# Patient Record
Sex: Male | Born: 1940 | Race: White | Hispanic: No | Marital: Married | Smoking: Former smoker
Health system: Southern US, Community
[De-identification: ages and names within clinical notes are randomized; demographics above are authoritative.]

## PROBLEM LIST (undated history)

## (undated) DIAGNOSIS — K219 Gastro-esophageal reflux disease without esophagitis: Secondary | ICD-10-CM

## (undated) DIAGNOSIS — M1990 Unspecified osteoarthritis, unspecified site: Secondary | ICD-10-CM

## (undated) DIAGNOSIS — N400 Enlarged prostate without lower urinary tract symptoms: Secondary | ICD-10-CM

## (undated) DIAGNOSIS — I10 Essential (primary) hypertension: Secondary | ICD-10-CM

## (undated) DIAGNOSIS — H269 Unspecified cataract: Secondary | ICD-10-CM

## (undated) DIAGNOSIS — N189 Chronic kidney disease, unspecified: Secondary | ICD-10-CM

## (undated) DIAGNOSIS — E785 Hyperlipidemia, unspecified: Secondary | ICD-10-CM

## (undated) HISTORY — DX: Unspecified osteoarthritis, unspecified site: M19.90

## (undated) HISTORY — DX: Gastro-esophageal reflux disease without esophagitis: K21.9

## (undated) HISTORY — DX: Benign prostatic hyperplasia without lower urinary tract symptoms: N40.0

## (undated) HISTORY — DX: Unspecified cataract: H26.9

## (undated) HISTORY — PX: TONSILLECTOMY: SUR1361

## (undated) HISTORY — DX: Essential (primary) hypertension: I10

## (undated) HISTORY — PX: POLYPECTOMY: SHX149

## (undated) HISTORY — PX: COLONOSCOPY: SHX174

## (undated) HISTORY — DX: Chronic kidney disease, unspecified: N18.9

## (undated) HISTORY — PX: ANGIOGRAM/LV (CONGENITAL): SHX6166

## (undated) HISTORY — DX: Hyperlipidemia, unspecified: E78.5

## (undated) HISTORY — PX: OTHER SURGICAL HISTORY: SHX169

---

## 2005-07-14 ENCOUNTER — Emergency Department (HOSPITAL_COMMUNITY): Admission: AD | Admit: 2005-07-14 | Discharge: 2005-07-14 | Payer: Self-pay | Admitting: Family Medicine

## 2006-02-17 ENCOUNTER — Encounter: Admission: RE | Admit: 2006-02-17 | Discharge: 2006-02-17 | Payer: Self-pay | Admitting: Internal Medicine

## 2006-03-03 ENCOUNTER — Ambulatory Visit (HOSPITAL_BASED_OUTPATIENT_CLINIC_OR_DEPARTMENT_OTHER): Admission: RE | Admit: 2006-03-03 | Discharge: 2006-03-03 | Payer: Self-pay | Admitting: Urology

## 2009-08-21 ENCOUNTER — Encounter: Payer: Self-pay | Admitting: Internal Medicine

## 2009-09-17 ENCOUNTER — Encounter (INDEPENDENT_AMBULATORY_CARE_PROVIDER_SITE_OTHER): Payer: Self-pay | Admitting: *Deleted

## 2009-10-20 ENCOUNTER — Encounter (INDEPENDENT_AMBULATORY_CARE_PROVIDER_SITE_OTHER): Payer: Self-pay | Admitting: *Deleted

## 2009-10-22 ENCOUNTER — Ambulatory Visit: Payer: Self-pay | Admitting: Internal Medicine

## 2009-11-05 ENCOUNTER — Ambulatory Visit: Payer: Self-pay | Admitting: Internal Medicine

## 2009-11-07 ENCOUNTER — Encounter: Payer: Self-pay | Admitting: Internal Medicine

## 2010-02-02 NOTE — Letter (Signed)
Summary: [facility] Instructions  [name]  [address] [place], [place] [address]   Phone: [phone]  Fax: [phone]       Paul Herring    01/28/1940    MRN: [id number]        Procedure Day [name]:  [name] [birth year]     Arrival Time: 8:00am     Procedure Time: 9:00am     Location of Procedure:                    [name]  [facility] (4th Floor)                        PREPARATION FOR COLONOSCOPY WITH MOVIPREP   Starting 5 days prior to your procedure SATURDAY 10/29  do not eat nuts, seeds, popcorn, corn, beans, peas,  salads, or any raw vegetables.  Do not take any fiber supplements (e.g. Metamucil, Citrucel, and Benefiber).  THE DAY BEFORE YOUR PROCEDURE         DATE: [facility]  11/02  1.  Drink clear liquids the entire day-NO SOLID FOOD  2.  Do not drink anything colored red or purple.  Avoid juices with pulp.  No orange juice.  3.  Drink at least 64 oz. (8 glasses) of fluid/clear liquids during the day to prevent dehydration and help the prep work efficiently.  CLEAR LIQUIDS INCLUDE: Water Jello Ice Popsicles Tea (sugar ok, no milk/cream) Powdered fruit flavored drinks Coffee (sugar ok, no milk/cream) Gatorade Juice: apple, white grape, white cranberry  Lemonade Clear bullion, consomm, broth Carbonated beverages (any kind) Strained chicken noodle soup Hard Candy                             4.  In the morning, mix first dose of MoviPrep solution:    Empty 1 Pouch A and 1 Pouch B into the disposable container    Add lukewarm drinking water to the top line of the container. Mix to dissolve    Refrigerate (mixed solution should be used within 24 hrs)  5.  Begin drinking the prep at 5:00 p.m. The MoviPrep container is divided by 4 marks.   Every 15 minutes drink the solution down to the next mark (approximately 8 oz) until the full liter is complete.   6.  Follow completed prep with 16 oz of clear liquid of your choice  (Nothing red or purple).  Continue to drink clear liquids until bedtime.  7.  Before going to bed, mix second dose of MoviPrep solution:    Empty 1 Pouch A and 1 Pouch B into the disposable container    Add lukewarm drinking water to the top line of the container. Mix to dissolve    Refrigerate  THE DAY OF YOUR PROCEDURE      DATE: THURSDAY 11/03  Beginning at  4:00 a.m. (5 hours before procedure):         1. Every 15 minutes, drink the solution down to the next mark (approx 8 oz) until the full liter is complete.  2. Follow completed prep with 16 oz. of clear liquid of your choice.    3. You may drink clear liquids until  7:00am  (2 HOURS BEFORE PROCEDURE).   MEDICATION INSTRUCTIONS  Unless otherwise instructed, you should take regular prescription medications with a small sip of water   as early as possible the morning  of your procedure.         OTHER INSTRUCTIONS  You will need a responsible adult at least 72 years of age to accompany you and drive you home.   This person must remain in the waiting room during your procedure.  Wear loose fitting clothing that is easily removed.  Leave jewelry and other valuables at home.  However, you may wish to bring a book to read or  an iPod/MP3 player to listen to music as you wait for your procedure to start.  Remove all body piercing jewelry and leave at home.  Total time from sign-in until discharge is approximately 2-3 hours.  You should go home directly after your procedure and rest.  You can resume normal activities the  day after your procedure.  The day of your procedure you should not:   Drive   Make legal decisions   Operate machinery   Drink alcohol   Return to work  You will receive specific instructions about eating, activities and medications before you leave.    The above instructions have been reviewed and explained to me by  Karl Bales RN  October 22, 2009 1:26 PM    I fully  understand and can verbalize these instructions _____________________________ Date _________

## 2010-02-02 NOTE — Procedures (Signed)
Summary: Colonoscopy  Patient: [name] Note: All result statuses are Final unless otherwise noted.  Tests: (1) Colonoscopy (COL)   COL Colonoscopy           DONE     [facility]     [address]     [place], [place]  [address]           COLONOSCOPY PROCEDURE REPORT           PATIENT:  Paul Herring, Paul Herring  MR#:  [id number]     BIRTHDATE:  [birth year], 69 yrs. old  GENDER:  male     ENDOSCOPIST:  Wilhemina Bonito. Eda Keys, MD     REF. BY:  Kari Baars, M.D.     PROCEDURE DATE:  11/05/2009     PROCEDURE:  Colonoscopy with snare polypectomy x 4     ASA CLASS:  Class II     INDICATIONS:  Routine Risk Screening     MEDICATIONS:   Fentanyl 100 mcg IV, Versed 10 mg IV           DESCRIPTION OF PROCEDURE:   After the risks benefits and     alternatives of the procedure were thoroughly explained, informed     consent was obtained.  Digital rectal exam was performed and     revealed no abnormalities.   The LB CF-[id number] endoscope     was introduced through the anus and advanced to the cecum, which     was identified by both the appendix and ileocecal valve, without     limitations.Time to cecum = 4:27 min. The quality of the prep was     excellent, using MoviPrep.  The instrument was then slowly     withdrawn (14:07 min) as the colon was fully examined.     <<PROCEDUREIMAGES>>           FINDINGS:  Four polyps were found - 6mm in the cecum and     73mm,3mm,6mm in ascending colon. Polyps were snared without     cautery. Retrieval was successful.   Severe diverticulosis was     found in the left colon.   Retroflexed views in the rectum     revealed internal hemorrhoids.    The scope was then withdrawn     from the patient and the procedure completed.           COMPLICATIONS:  None     ENDOSCOPIC IMPRESSION:     1) Four polyps - removed     2) Severe diverticulosis in the left colon     3) Internal hemorrhoids           RECOMMENDATIONS:     1) Follow up colonoscopy in 5  years           ______________________________     Wilhemina Bonito. Eda Keys, MD           CC:  Kari Baars, MD; The Patient           n.     eSIGNED:   Wilhemina Bonito. Eda Keys at 11/05/2009 10:03 AM           Royetta Crochet, [id number]  Note: An exclamation mark (!) indicates a result that was not dispersed into the flowsheet. Document Creation Date: 11/05/2009 10:04 AM _______________________________________________________________________  (1) Order result status: Final Collection or observation date-time: 11/05/2009 09:55 Requested date-time:  Receipt date-time:  Reported date-time:  Referring Physician:   Ordering Physician: Jonny Ruiz  Eda Keys [phone]) Specimen Source:  Source: Launa Grill Order Number: [id number] Lab site:   Appended Document: Colonoscopy     Procedures Next Due Date:    Colonoscopy: 11/2014

## 2010-02-02 NOTE — Letter (Signed)
Summary: [facility]  [facility]   Imported By: Sherian Rein 11/19/2009 14:17:59  _____________________________________________________________________  External Attachment:    Type:   Image     Comment:   External Document

## 2010-02-02 NOTE — Letter (Signed)
Summary: Patient's Medication List/[facility]  Patient's Medication List/[facility]   Imported By: Sherian Rein 11/19/2009 14:16:25  _____________________________________________________________________  External Attachment:    Type:   Image     Comment:   External Document

## 2010-02-02 NOTE — Miscellaneous (Signed)
Summary: [facility] previsit  Clinical Lists Changes  Medications: Added new medication of MOVIPREP 100 GM  SOLR (PEG-KCL-NACL-NASULF-NA ASC-C) As per prep instructions. - Signed Rx of MOVIPREP 100 GM  SOLR (PEG-KCL-NACL-NASULF-NA ASC-C) As per prep instructions.;  #1 x 0;  Signed;  Entered by: Karl Bales RN;  Authorized by: Hilarie Fredrickson MD;  Method used: [facility]  [phone]*, [address], [place], [place]  [address], Ph: [phone] or [phone], Fax: [phone] Allergies: Added new allergy or adverse reaction of TALWIN Observations: Added new observation of NKA: F (10/22/2009 12:55)    Prescriptions: MOVIPREP 100 GM  SOLR (PEG-KCL-NACL-NASULF-NA ASC-C) As per prep instructions.  #1 x 0   Entered by:   Karl Bales RN   Authorized by:   Hilarie Fredrickson MD   Signed by:   Karl Bales RN on 10/22/2009   Method used:   [facility] to        [facility]  [phone]* (retail)       [address] [place], [place]  [address]       Ph: [phone] or [phone]       Fax: [phone]   RxID:   [id number]

## 2010-02-02 NOTE — Letter (Signed)
Summary: Pre Visit Letter Revised  [facility]  [address] [place], [place] [address]   Phone: [phone]  Fax: [phone]        09/17/2009 MRN: [id number] [facility] [address] [place], [place]  [address]             Procedure Date:  11-05-09   Welcome to the Gastroenterology Division at [facility].    You are scheduled to see a nurse for your pre-procedure visit on 10-22-09 at 1:00p.m. on the 3rd floor at [facility], [address]  We ask that you try to arrive at our office 15 minutes prior to your appointment time to allow for check-in.  Please take a minute to review the attached form.  If you answer "Yes" to one or more of the questions on the first page, we ask that you call the person listed at your earliest opportunity.  If you answer "No" to all of the questions, please complete the rest of the form and bring it to your appointment.    Your nurse visit will consist of discussing your medical and surgical history, your immediate family medical history, and your medications.   If you are unable to list all of your medications on the form, please bring the medication bottles to your appointment and we will list them.  We will need to be aware of both prescribed and over the counter drugs.  We will need to know exact dosage information as well.    Please be prepared to read and sign documents such as consent forms, a financial agreement, and acknowledgement forms.  If necessary, and with your consent, a friend or relative is welcome to sit-in on the nurse visit with you.  Please bring your insurance card so that we may make a copy of it.  If your insurance requires a referral to see a specialist, please bring your referral form from your primary care physician.  No co-pay is required for this nurse visit.     If you cannot keep your appointment, please call [phone] to cancel or reschedule prior to your appointment date.  This  allows [place] the opportunity to schedule an appointment for another patient in need of care.    Thank you for choosing [facility] for your medical needs.  We appreciate the opportunity to care for you.  Please visit [place] at our website  to learn more about our practice.  Sincerely, The Gastroenterology Division

## 2010-02-02 NOTE — Letter (Signed)
Summary: Patient Notice- Polyp Results  [facility]  [address] [place], [place] [address]   Phone: [phone]  Fax: [phone]        November 07, 2009 MRN: [id number]    [facility] [address] [place], [place]  [address]    Dear Paul Herring,  I am pleased to inform you that the colon polyp(s) removed during your recent colonoscopy was (were) found to be benign (no cancer detected) upon pathologic examination.  I recommend you have a repeat colonoscopy examination in 5 years to look for recurrent polyps, as having colon polyps increases your risk for having recurrent polyps or even colon cancer in the future.  Should you develop new or worsening symptoms of abdominal pain, bowel habit changes or bleeding from the rectum or bowels, please schedule an evaluation with either your primary care physician or with me.   Additional information/recommendations:  __ No further action with gastroenterology is needed at this time. Please      follow-up with your primary care physician for your other healthcare      needs.    Please call us if you are having persistent problems or have questions about your condition that have not been fully answered at this time.  Sincerely,  Paul Fredrickson MD  This letter has been electronically signed by your physician.  Appended Document: Patient Notice- Polyp Results letter mailed

## 2010-05-21 NOTE — Op Note (Signed)
NAME:  Paul Herring, Paul Herring              ACCOUNT NO.:  [id number]   MEDICAL RECORD NO.:  [id number]          PATIENT TYPE:  AMB   LOCATION:  [facility]                         FACILITY:  [facility]   PHYSICIAN:  Mark C. Vernie Ammons, M.D.  DATE OF BIRTH:  1940-06-28   DATE OF PROCEDURE:  03/03/2006  DATE OF DISCHARGE:                               OPERATIVE REPORT   PREOP DIAGNOSIS:  Bilateral hydronephrosis.   POSTOP DIAGNOSIS:  1. Bilateral hydronephrosis.  2. Possible bladder mass.  3. Benign prostatic hypertrophy with bladder outlet obstruction.   PROCEDURE:  1. Cystoscopy.  2. Bilateral retrograde pyelograms with interpretation.  3. Right ureteroscopy.  4. Bilateral double-J stent placements.   SURGEON:  Mark C. Vernie Ammons, M.D.   ANESTHESIA:  General.   BLOOD LOSS:  Approximately 25 mL.   DRAINS:  A 6-French, 26-cm, Polaris stent in right-and-left ureter (no  string) and 16-French Foley catheter.   COMPLICATIONS:  None.   INDICATIONS:  The patient is a 70 year old white male who I initially  saw for obstructive voiding symptoms.  I placed him on some Flomax; and  he noted some improvement in his voiding; however, when he followed up  with his primary care physician, he was found to have an elevated  creatinine to 2.5.  A renal ultrasound revealed bilateral  hydronephrosis.  I placed a Foley catheter in his bladder; and follow-up  ultrasound revealed decreased hydro although there was some still  present.  There was also improvement of his creatinine.  He was brought  to the OR today for further investigation of other causes of his  bilateral hydronephrosis; and an evaluation of the prostatic urethra  with stent placement.   DESCRIPTION OF OPERATION:  After informed consent, the patient was  brought to the major OR, and placed on the table and administered  general anesthesia, then moved to the dorsal lithotomy position.  His  genitalia was sterilely prepped and draped.  Initially I  introduced a 76-  [place] cystoscope with 12-degree lens per urethra; and under direct  visualization of the urethra entirely normal down to the sphincter which  appears intact.  The prostate revealed bilobar hypertrophy; and a large  median lobe with a high bladder neck.  Upon entering the bladder, I  note, Foley catheter irritation of the wall of the bladder.  No  worrisome tumors or stones, or other lesions were seen on the posterior  wall; but near the floor of the bladder, a little bit more toward the  right-hand side, there appeared to be a raised area that appeared to be  separate from the prostate; and did not appear to be the median lobe.  This was visualized with both the 12-and-70-degree lenses.  It was broad-  based, separate, and appeared cystic with normal-appearing mucosa  overlying this.  There was also 4+ trabeculation with some small  cellules seen.   A 6-French open-ended ureteral catheter was then placed in the right  ureteral orifice; and full strength iodinated contrast was then injected  through the open-ended catheter; and under direct visualization I noted  significant J-hooking of  the distal ureter.  There was no other  abnormality seen along the course of the ureter such as filling defects  or mass effect.  It was tortuous; and then the renal pelvis was noted be  fairly dilated.  The calyces appeared to be blunted.  There was no  definite filling defects seen within the renal pelvis or ureter.  I then  attempted to pass a floppy-tip guidewire through the open-ended ureteral  catheter; but was unsuccessful, so I switched to a Glidewire.  I still  was unable to get the Glidewire to progress up the ureter; and it  appeared that due to the severe J-hooking that the Glidewire was noted  to be what appeared to be outside the ureter.  I, therefore, inserted  the flexible ureteroscope, but could not visualize the ureteral orifice  well enough, so I switched to the  6-French rigid ureteroscope; and  passed this, under direct vision into the bladder.   I was able to identify the right ureteral orifice; and passed the  ureteroscope into the distal ureter.  I did note where the guidewire  appeared to have gone through the wall of the ureter because of the  severe turn and J-hooking and also visualized the lumen.  With the lumen  visualized, I passed the guidewire through the ureteroscope and through  the lumen up into the renal pelvis without difficulty, under direct  fluoroscopic control.  I then removed the ureteroscope, and left the  guidewire in place.  Over the guidewire, I passed the cystoscope back  down into the bladder, and then passed the Polaris stent over the  guidewire, up the right ureter, and then removed the guidewire with good  curl being noted in the renal pelvis.   I then directed my attention to the left orifice.  It was identified and  the open-ended 6-French ureteral catheter was placed in the orifice; and  her left retrograde pyelogram was performed in identical fashion with  nearly identical findings, that being fairly significant J-hooking of  the ureter, a little bit greater hydroureter, and a renal pelvis that  appeared to be dilated with significant clubbing of the calyces.  No  filling defects were noted on this side.  On the left side, I was able  to pass the Glidewire through the open-ended catheter; and negotiated up  the left ureter with some manipulation into the area of the renal pelvis  under fluoroscopy.  I then, again, reinserted the cystoscope over the  Glidewire and passed the Polaris stent and then removed the Glidewire  with good curl, again, being noted on the left side.  I then drained the  bladder; and reinserted the 16-French Foley catheter.   The patient was awakened and taken to recovery in stable satisfactory  condition.  He tolerated the procedure well; there were no intraoperative complications.  He  will be given written instructions  upon discharge; and was given a prescription for Vicodin HP #28 and  Cipro 500 mg b.i.d. #14.   The mass that was seen in the bladder is of undetermined etiology; and  may be nothing of significance; however, it does warrant further  investigation.  I, therefore, am going schedule him for a CT scan of the  pelvis, the same day that he has his urodynamic study.  I also obtained  a creatinine today to further document whether there has been a  continued fall in the creatinine since the catheter has been in.  Mark C. Vernie Ammons, M.D.  Electronically Signed     MCO/MEDQ  D:  03/03/2006  T:  03/03/2006  Job:  [id number]

## 2011-01-20 DIAGNOSIS — H2510 Age-related nuclear cataract, unspecified eye: Secondary | ICD-10-CM | POA: Diagnosis not present

## 2011-01-21 DIAGNOSIS — Z23 Encounter for immunization: Secondary | ICD-10-CM | POA: Diagnosis not present

## 2011-02-07 DIAGNOSIS — R05 Cough: Secondary | ICD-10-CM | POA: Diagnosis not present

## 2011-02-07 DIAGNOSIS — J069 Acute upper respiratory infection, unspecified: Secondary | ICD-10-CM | POA: Diagnosis not present

## 2011-02-09 DIAGNOSIS — E785 Hyperlipidemia, unspecified: Secondary | ICD-10-CM | POA: Diagnosis not present

## 2011-02-24 DIAGNOSIS — I10 Essential (primary) hypertension: Secondary | ICD-10-CM | POA: Diagnosis not present

## 2011-02-24 DIAGNOSIS — J189 Pneumonia, unspecified organism: Secondary | ICD-10-CM | POA: Diagnosis not present

## 2011-02-24 DIAGNOSIS — R05 Cough: Secondary | ICD-10-CM | POA: Diagnosis not present

## 2011-03-01 DIAGNOSIS — L821 Other seborrheic keratosis: Secondary | ICD-10-CM | POA: Diagnosis not present

## 2011-03-01 DIAGNOSIS — L570 Actinic keratosis: Secondary | ICD-10-CM | POA: Diagnosis not present

## 2011-03-01 DIAGNOSIS — D239 Other benign neoplasm of skin, unspecified: Secondary | ICD-10-CM | POA: Diagnosis not present

## 2011-04-27 DIAGNOSIS — R079 Chest pain, unspecified: Secondary | ICD-10-CM | POA: Diagnosis not present

## 2011-06-27 DIAGNOSIS — Z23 Encounter for immunization: Secondary | ICD-10-CM | POA: Diagnosis not present

## 2011-07-14 DIAGNOSIS — H2589 Other age-related cataract: Secondary | ICD-10-CM | POA: Diagnosis not present

## 2011-09-08 DIAGNOSIS — Z23 Encounter for immunization: Secondary | ICD-10-CM | POA: Diagnosis not present

## 2011-09-15 DIAGNOSIS — N401 Enlarged prostate with lower urinary tract symptoms: Secondary | ICD-10-CM | POA: Diagnosis not present

## 2011-09-15 DIAGNOSIS — R972 Elevated prostate specific antigen [PSA]: Secondary | ICD-10-CM | POA: Diagnosis not present

## 2011-09-21 DIAGNOSIS — N401 Enlarged prostate with lower urinary tract symptoms: Secondary | ICD-10-CM | POA: Diagnosis not present

## 2011-10-24 DIAGNOSIS — H2510 Age-related nuclear cataract, unspecified eye: Secondary | ICD-10-CM | POA: Diagnosis not present

## 2011-10-24 DIAGNOSIS — H18419 Arcus senilis, unspecified eye: Secondary | ICD-10-CM | POA: Diagnosis not present

## 2011-10-24 DIAGNOSIS — H52229 Regular astigmatism, unspecified eye: Secondary | ICD-10-CM | POA: Diagnosis not present

## 2011-10-24 DIAGNOSIS — H3530 Unspecified macular degeneration: Secondary | ICD-10-CM | POA: Diagnosis not present

## 2011-11-03 DIAGNOSIS — E785 Hyperlipidemia, unspecified: Secondary | ICD-10-CM | POA: Diagnosis not present

## 2011-11-03 DIAGNOSIS — R7301 Impaired fasting glucose: Secondary | ICD-10-CM | POA: Diagnosis not present

## 2011-11-03 DIAGNOSIS — I10 Essential (primary) hypertension: Secondary | ICD-10-CM | POA: Diagnosis not present

## 2011-11-03 DIAGNOSIS — M109 Gout, unspecified: Secondary | ICD-10-CM | POA: Diagnosis not present

## 2011-11-10 DIAGNOSIS — I10 Essential (primary) hypertension: Secondary | ICD-10-CM | POA: Diagnosis not present

## 2011-11-10 DIAGNOSIS — E785 Hyperlipidemia, unspecified: Secondary | ICD-10-CM | POA: Diagnosis not present

## 2011-11-10 DIAGNOSIS — Z1331 Encounter for screening for depression: Secondary | ICD-10-CM | POA: Diagnosis not present

## 2011-11-10 DIAGNOSIS — Z0000 Encounter for general adult medical examination without abnormal findings: Secondary | ICD-10-CM | POA: Diagnosis not present

## 2012-01-09 DIAGNOSIS — H269 Unspecified cataract: Secondary | ICD-10-CM | POA: Diagnosis not present

## 2012-01-09 DIAGNOSIS — H2510 Age-related nuclear cataract, unspecified eye: Secondary | ICD-10-CM | POA: Diagnosis not present

## 2012-01-10 DIAGNOSIS — H2510 Age-related nuclear cataract, unspecified eye: Secondary | ICD-10-CM | POA: Diagnosis not present

## 2012-01-23 DIAGNOSIS — H2510 Age-related nuclear cataract, unspecified eye: Secondary | ICD-10-CM | POA: Diagnosis not present

## 2012-01-23 DIAGNOSIS — H269 Unspecified cataract: Secondary | ICD-10-CM | POA: Diagnosis not present

## 2012-09-12 DIAGNOSIS — Z23 Encounter for immunization: Secondary | ICD-10-CM | POA: Diagnosis not present

## 2012-09-19 DIAGNOSIS — N401 Enlarged prostate with lower urinary tract symptoms: Secondary | ICD-10-CM | POA: Diagnosis not present

## 2012-09-26 DIAGNOSIS — R972 Elevated prostate specific antigen [PSA]: Secondary | ICD-10-CM | POA: Diagnosis not present

## 2012-09-26 DIAGNOSIS — N401 Enlarged prostate with lower urinary tract symptoms: Secondary | ICD-10-CM | POA: Diagnosis not present

## 2012-11-06 DIAGNOSIS — R7301 Impaired fasting glucose: Secondary | ICD-10-CM | POA: Diagnosis not present

## 2012-11-06 DIAGNOSIS — M109 Gout, unspecified: Secondary | ICD-10-CM | POA: Diagnosis not present

## 2012-11-06 DIAGNOSIS — I10 Essential (primary) hypertension: Secondary | ICD-10-CM | POA: Diagnosis not present

## 2012-11-06 DIAGNOSIS — E785 Hyperlipidemia, unspecified: Secondary | ICD-10-CM | POA: Diagnosis not present

## 2012-11-14 DIAGNOSIS — Z1212 Encounter for screening for malignant neoplasm of rectum: Secondary | ICD-10-CM | POA: Diagnosis not present

## 2012-11-20 DIAGNOSIS — S058X9A Other injuries of unspecified eye and orbit, initial encounter: Secondary | ICD-10-CM | POA: Diagnosis not present

## 2012-11-21 DIAGNOSIS — S058X9A Other injuries of unspecified eye and orbit, initial encounter: Secondary | ICD-10-CM | POA: Diagnosis not present

## 2012-11-26 DIAGNOSIS — M109 Gout, unspecified: Secondary | ICD-10-CM | POA: Diagnosis not present

## 2012-11-26 DIAGNOSIS — E785 Hyperlipidemia, unspecified: Secondary | ICD-10-CM | POA: Diagnosis not present

## 2012-11-26 DIAGNOSIS — Z0000 Encounter for general adult medical examination without abnormal findings: Secondary | ICD-10-CM | POA: Diagnosis not present

## 2012-11-26 DIAGNOSIS — Z23 Encounter for immunization: Secondary | ICD-10-CM | POA: Diagnosis not present

## 2012-11-26 DIAGNOSIS — K219 Gastro-esophageal reflux disease without esophagitis: Secondary | ICD-10-CM | POA: Diagnosis not present

## 2012-11-26 DIAGNOSIS — Z1331 Encounter for screening for depression: Secondary | ICD-10-CM | POA: Diagnosis not present

## 2012-11-26 DIAGNOSIS — N401 Enlarged prostate with lower urinary tract symptoms: Secondary | ICD-10-CM | POA: Diagnosis not present

## 2012-11-26 DIAGNOSIS — I10 Essential (primary) hypertension: Secondary | ICD-10-CM | POA: Diagnosis not present

## 2012-11-26 DIAGNOSIS — R7301 Impaired fasting glucose: Secondary | ICD-10-CM | POA: Diagnosis not present

## 2013-01-10 DIAGNOSIS — L851 Acquired keratosis [keratoderma] palmaris et plantaris: Secondary | ICD-10-CM | POA: Diagnosis not present

## 2013-01-10 DIAGNOSIS — L821 Other seborrheic keratosis: Secondary | ICD-10-CM | POA: Diagnosis not present

## 2013-01-10 DIAGNOSIS — D239 Other benign neoplasm of skin, unspecified: Secondary | ICD-10-CM | POA: Diagnosis not present

## 2013-01-10 DIAGNOSIS — D2370 Other benign neoplasm of skin of unspecified lower limb, including hip: Secondary | ICD-10-CM | POA: Diagnosis not present

## 2013-02-05 DIAGNOSIS — M5137 Other intervertebral disc degeneration, lumbosacral region: Secondary | ICD-10-CM | POA: Diagnosis not present

## 2013-02-15 DIAGNOSIS — Q762 Congenital spondylolisthesis: Secondary | ICD-10-CM | POA: Diagnosis not present

## 2013-02-15 DIAGNOSIS — M5137 Other intervertebral disc degeneration, lumbosacral region: Secondary | ICD-10-CM | POA: Diagnosis not present

## 2013-03-06 DIAGNOSIS — M5137 Other intervertebral disc degeneration, lumbosacral region: Secondary | ICD-10-CM | POA: Diagnosis not present

## 2013-03-06 DIAGNOSIS — M545 Low back pain, unspecified: Secondary | ICD-10-CM | POA: Diagnosis not present

## 2013-03-08 DIAGNOSIS — M5137 Other intervertebral disc degeneration, lumbosacral region: Secondary | ICD-10-CM | POA: Diagnosis not present

## 2013-03-08 DIAGNOSIS — M545 Low back pain, unspecified: Secondary | ICD-10-CM | POA: Diagnosis not present

## 2013-03-14 DIAGNOSIS — Q762 Congenital spondylolisthesis: Secondary | ICD-10-CM | POA: Diagnosis not present

## 2013-03-14 DIAGNOSIS — M5137 Other intervertebral disc degeneration, lumbosacral region: Secondary | ICD-10-CM | POA: Diagnosis not present

## 2013-05-16 DIAGNOSIS — M5137 Other intervertebral disc degeneration, lumbosacral region: Secondary | ICD-10-CM | POA: Diagnosis not present

## 2013-05-20 ENCOUNTER — Other Ambulatory Visit: Payer: Self-pay | Admitting: Orthopaedic Surgery

## 2013-05-20 DIAGNOSIS — M545 Low back pain, unspecified: Secondary | ICD-10-CM

## 2013-05-20 DIAGNOSIS — M5410 Radiculopathy, site unspecified: Secondary | ICD-10-CM

## 2013-05-29 ENCOUNTER — Ambulatory Visit
Admission: RE | Admit: 2013-05-29 | Discharge: 2013-05-29 | Disposition: A | Discharge status: Home / Self Care | Payer: Medicare Other | Source: Ambulatory Visit | Attending: Orthopaedic Surgery | Admitting: Orthopaedic Surgery

## 2013-05-29 DIAGNOSIS — M5126 Other intervertebral disc displacement, lumbar region: Secondary | ICD-10-CM | POA: Diagnosis not present

## 2013-05-29 DIAGNOSIS — M545 Low back pain, unspecified: Secondary | ICD-10-CM

## 2013-05-29 DIAGNOSIS — M47817 Spondylosis without myelopathy or radiculopathy, lumbosacral region: Secondary | ICD-10-CM | POA: Diagnosis not present

## 2013-05-29 DIAGNOSIS — M48061 Spinal stenosis, lumbar region without neurogenic claudication: Secondary | ICD-10-CM | POA: Diagnosis not present

## 2013-05-29 DIAGNOSIS — M5410 Radiculopathy, site unspecified: Secondary | ICD-10-CM

## 2013-05-30 DIAGNOSIS — M5137 Other intervertebral disc degeneration, lumbosacral region: Secondary | ICD-10-CM | POA: Diagnosis not present

## 2013-06-06 DIAGNOSIS — M4120 Other idiopathic scoliosis, site unspecified: Secondary | ICD-10-CM | POA: Diagnosis not present

## 2013-06-06 DIAGNOSIS — M47817 Spondylosis without myelopathy or radiculopathy, lumbosacral region: Secondary | ICD-10-CM | POA: Diagnosis not present

## 2013-06-06 DIAGNOSIS — IMO0002 Reserved for concepts with insufficient information to code with codable children: Secondary | ICD-10-CM | POA: Diagnosis not present

## 2013-06-06 DIAGNOSIS — M48061 Spinal stenosis, lumbar region without neurogenic claudication: Secondary | ICD-10-CM | POA: Diagnosis not present

## 2013-06-13 DIAGNOSIS — M48061 Spinal stenosis, lumbar region without neurogenic claudication: Secondary | ICD-10-CM | POA: Diagnosis not present

## 2013-06-13 DIAGNOSIS — M47817 Spondylosis without myelopathy or radiculopathy, lumbosacral region: Secondary | ICD-10-CM | POA: Diagnosis not present

## 2013-06-13 DIAGNOSIS — IMO0002 Reserved for concepts with insufficient information to code with codable children: Secondary | ICD-10-CM | POA: Diagnosis not present

## 2013-09-30 DIAGNOSIS — Z23 Encounter for immunization: Secondary | ICD-10-CM | POA: Diagnosis not present

## 2013-10-09 DIAGNOSIS — R972 Elevated prostate specific antigen [PSA]: Secondary | ICD-10-CM | POA: Diagnosis not present

## 2013-10-15 ENCOUNTER — Encounter: Payer: Self-pay | Admitting: Internal Medicine

## 2013-10-16 DIAGNOSIS — N401 Enlarged prostate with lower urinary tract symptoms: Secondary | ICD-10-CM | POA: Diagnosis not present

## 2013-10-16 DIAGNOSIS — R972 Elevated prostate specific antigen [PSA]: Secondary | ICD-10-CM | POA: Diagnosis not present

## 2013-10-16 DIAGNOSIS — N138 Other obstructive and reflux uropathy: Secondary | ICD-10-CM | POA: Diagnosis not present

## 2013-12-04 DIAGNOSIS — R7301 Impaired fasting glucose: Secondary | ICD-10-CM | POA: Diagnosis not present

## 2013-12-04 DIAGNOSIS — E785 Hyperlipidemia, unspecified: Secondary | ICD-10-CM | POA: Diagnosis not present

## 2013-12-04 DIAGNOSIS — M109 Gout, unspecified: Secondary | ICD-10-CM | POA: Diagnosis not present

## 2013-12-04 DIAGNOSIS — I10 Essential (primary) hypertension: Secondary | ICD-10-CM | POA: Diagnosis not present

## 2013-12-04 DIAGNOSIS — Z0000 Encounter for general adult medical examination without abnormal findings: Secondary | ICD-10-CM | POA: Diagnosis not present

## 2013-12-09 DIAGNOSIS — I10 Essential (primary) hypertension: Secondary | ICD-10-CM | POA: Diagnosis not present

## 2013-12-09 DIAGNOSIS — R7301 Impaired fasting glucose: Secondary | ICD-10-CM | POA: Diagnosis not present

## 2013-12-09 DIAGNOSIS — Z0000 Encounter for general adult medical examination without abnormal findings: Secondary | ICD-10-CM | POA: Diagnosis not present

## 2013-12-09 DIAGNOSIS — K219 Gastro-esophageal reflux disease without esophagitis: Secondary | ICD-10-CM | POA: Diagnosis not present

## 2013-12-09 DIAGNOSIS — Z1212 Encounter for screening for malignant neoplasm of rectum: Secondary | ICD-10-CM | POA: Diagnosis not present

## 2013-12-09 DIAGNOSIS — Z1389 Encounter for screening for other disorder: Secondary | ICD-10-CM | POA: Diagnosis not present

## 2013-12-09 DIAGNOSIS — R972 Elevated prostate specific antigen [PSA]: Secondary | ICD-10-CM | POA: Diagnosis not present

## 2013-12-09 DIAGNOSIS — N401 Enlarged prostate with lower urinary tract symptoms: Secondary | ICD-10-CM | POA: Diagnosis not present

## 2013-12-09 DIAGNOSIS — N183 Chronic kidney disease, stage 3 (moderate): Secondary | ICD-10-CM | POA: Diagnosis not present

## 2013-12-09 DIAGNOSIS — E785 Hyperlipidemia, unspecified: Secondary | ICD-10-CM | POA: Diagnosis not present

## 2013-12-24 DIAGNOSIS — Z1212 Encounter for screening for malignant neoplasm of rectum: Secondary | ICD-10-CM | POA: Diagnosis not present

## 2014-02-19 DIAGNOSIS — M5416 Radiculopathy, lumbar region: Secondary | ICD-10-CM | POA: Diagnosis not present

## 2014-02-19 DIAGNOSIS — M4806 Spinal stenosis, lumbar region: Secondary | ICD-10-CM | POA: Diagnosis not present

## 2014-02-20 DIAGNOSIS — M5416 Radiculopathy, lumbar region: Secondary | ICD-10-CM | POA: Diagnosis not present

## 2014-02-20 DIAGNOSIS — M4806 Spinal stenosis, lumbar region: Secondary | ICD-10-CM | POA: Diagnosis not present

## 2014-03-07 DIAGNOSIS — H5212 Myopia, left eye: Secondary | ICD-10-CM | POA: Diagnosis not present

## 2014-03-07 DIAGNOSIS — H3530 Unspecified macular degeneration: Secondary | ICD-10-CM | POA: Diagnosis not present

## 2014-03-07 DIAGNOSIS — H3531 Nonexudative age-related macular degeneration: Secondary | ICD-10-CM | POA: Diagnosis not present

## 2014-03-07 DIAGNOSIS — H52222 Regular astigmatism, left eye: Secondary | ICD-10-CM | POA: Diagnosis not present

## 2014-03-07 DIAGNOSIS — H524 Presbyopia: Secondary | ICD-10-CM | POA: Diagnosis not present

## 2014-10-07 DIAGNOSIS — Z23 Encounter for immunization: Secondary | ICD-10-CM | POA: Diagnosis not present

## 2014-10-15 DIAGNOSIS — N401 Enlarged prostate with lower urinary tract symptoms: Secondary | ICD-10-CM | POA: Diagnosis not present

## 2014-10-15 DIAGNOSIS — R972 Elevated prostate specific antigen [PSA]: Secondary | ICD-10-CM | POA: Diagnosis not present

## 2014-10-22 DIAGNOSIS — R972 Elevated prostate specific antigen [PSA]: Secondary | ICD-10-CM | POA: Diagnosis not present

## 2014-10-22 DIAGNOSIS — N138 Other obstructive and reflux uropathy: Secondary | ICD-10-CM | POA: Diagnosis not present

## 2014-10-22 DIAGNOSIS — N401 Enlarged prostate with lower urinary tract symptoms: Secondary | ICD-10-CM | POA: Diagnosis not present

## 2014-11-17 ENCOUNTER — Encounter: Payer: Self-pay | Admitting: Internal Medicine

## 2014-12-09 DIAGNOSIS — N183 Chronic kidney disease, stage 3 (moderate): Secondary | ICD-10-CM | POA: Diagnosis not present

## 2014-12-09 DIAGNOSIS — E784 Other hyperlipidemia: Secondary | ICD-10-CM | POA: Diagnosis not present

## 2014-12-09 DIAGNOSIS — R7301 Impaired fasting glucose: Secondary | ICD-10-CM | POA: Diagnosis not present

## 2014-12-09 DIAGNOSIS — M109 Gout, unspecified: Secondary | ICD-10-CM | POA: Diagnosis not present

## 2014-12-16 DIAGNOSIS — R7301 Impaired fasting glucose: Secondary | ICD-10-CM | POA: Diagnosis not present

## 2014-12-16 DIAGNOSIS — E784 Other hyperlipidemia: Secondary | ICD-10-CM | POA: Diagnosis not present

## 2014-12-16 DIAGNOSIS — Z0000 Encounter for general adult medical examination without abnormal findings: Secondary | ICD-10-CM | POA: Diagnosis not present

## 2014-12-16 DIAGNOSIS — I10 Essential (primary) hypertension: Secondary | ICD-10-CM | POA: Diagnosis not present

## 2014-12-16 DIAGNOSIS — N183 Chronic kidney disease, stage 3 (moderate): Secondary | ICD-10-CM | POA: Diagnosis not present

## 2014-12-16 DIAGNOSIS — N401 Enlarged prostate with lower urinary tract symptoms: Secondary | ICD-10-CM | POA: Diagnosis not present

## 2014-12-16 DIAGNOSIS — M109 Gout, unspecified: Secondary | ICD-10-CM | POA: Diagnosis not present

## 2014-12-16 DIAGNOSIS — R972 Elevated prostate specific antigen [PSA]: Secondary | ICD-10-CM | POA: Diagnosis not present

## 2014-12-16 DIAGNOSIS — K219 Gastro-esophageal reflux disease without esophagitis: Secondary | ICD-10-CM | POA: Diagnosis not present

## 2014-12-16 DIAGNOSIS — M5416 Radiculopathy, lumbar region: Secondary | ICD-10-CM | POA: Diagnosis not present

## 2014-12-16 DIAGNOSIS — Z6831 Body mass index (BMI) 31.0-31.9, adult: Secondary | ICD-10-CM | POA: Diagnosis not present

## 2014-12-16 DIAGNOSIS — Z1389 Encounter for screening for other disorder: Secondary | ICD-10-CM | POA: Diagnosis not present

## 2015-03-31 DIAGNOSIS — H353122 Nonexudative age-related macular degeneration, left eye, intermediate dry stage: Secondary | ICD-10-CM | POA: Diagnosis not present

## 2015-03-31 DIAGNOSIS — H52223 Regular astigmatism, bilateral: Secondary | ICD-10-CM | POA: Diagnosis not present

## 2015-03-31 DIAGNOSIS — H353112 Nonexudative age-related macular degeneration, right eye, intermediate dry stage: Secondary | ICD-10-CM | POA: Diagnosis not present

## 2015-03-31 DIAGNOSIS — Z961 Presence of intraocular lens: Secondary | ICD-10-CM | POA: Diagnosis not present

## 2015-03-31 DIAGNOSIS — H5201 Hypermetropia, right eye: Secondary | ICD-10-CM | POA: Diagnosis not present

## 2015-04-08 DIAGNOSIS — M791 Myalgia: Secondary | ICD-10-CM | POA: Diagnosis not present

## 2015-04-08 DIAGNOSIS — M47812 Spondylosis without myelopathy or radiculopathy, cervical region: Secondary | ICD-10-CM | POA: Diagnosis not present

## 2015-04-08 DIAGNOSIS — M5416 Radiculopathy, lumbar region: Secondary | ICD-10-CM | POA: Diagnosis not present

## 2015-04-08 DIAGNOSIS — M4806 Spinal stenosis, lumbar region: Secondary | ICD-10-CM | POA: Diagnosis not present

## 2015-04-21 DIAGNOSIS — M545 Low back pain: Secondary | ICD-10-CM | POA: Diagnosis not present

## 2015-04-28 DIAGNOSIS — M545 Low back pain: Secondary | ICD-10-CM | POA: Diagnosis not present

## 2015-05-05 DIAGNOSIS — M545 Low back pain: Secondary | ICD-10-CM | POA: Diagnosis not present

## 2015-09-08 DIAGNOSIS — Z23 Encounter for immunization: Secondary | ICD-10-CM | POA: Diagnosis not present

## 2015-10-27 DIAGNOSIS — R972 Elevated prostate specific antigen [PSA]: Secondary | ICD-10-CM | POA: Diagnosis not present

## 2015-10-27 DIAGNOSIS — R3915 Urgency of urination: Secondary | ICD-10-CM | POA: Diagnosis not present

## 2015-10-27 DIAGNOSIS — N401 Enlarged prostate with lower urinary tract symptoms: Secondary | ICD-10-CM | POA: Diagnosis not present

## 2015-11-03 DIAGNOSIS — L821 Other seborrheic keratosis: Secondary | ICD-10-CM | POA: Diagnosis not present

## 2015-11-03 DIAGNOSIS — D1801 Hemangioma of skin and subcutaneous tissue: Secondary | ICD-10-CM | POA: Diagnosis not present

## 2015-11-18 ENCOUNTER — Ambulatory Visit (INDEPENDENT_AMBULATORY_CARE_PROVIDER_SITE_OTHER): Payer: Medicare Other | Admitting: Physical Medicine and Rehabilitation

## 2015-11-18 ENCOUNTER — Encounter (INDEPENDENT_AMBULATORY_CARE_PROVIDER_SITE_OTHER): Payer: Self-pay | Admitting: Physical Medicine and Rehabilitation

## 2015-11-18 VITALS — BP 123/73 | HR 69

## 2015-11-18 DIAGNOSIS — M48062 Spinal stenosis, lumbar region with neurogenic claudication: Secondary | ICD-10-CM

## 2015-11-18 DIAGNOSIS — M545 Low back pain: Secondary | ICD-10-CM | POA: Diagnosis not present

## 2015-11-18 DIAGNOSIS — G8929 Other chronic pain: Secondary | ICD-10-CM

## 2015-11-18 DIAGNOSIS — M47816 Spondylosis without myelopathy or radiculopathy, lumbar region: Secondary | ICD-10-CM | POA: Diagnosis not present

## 2015-11-18 MED ORDER — DIAZEPAM 5 MG PO TABS: 5.0000 mg | ORAL_TABLET | ORAL | 0 refills | Status: AC

## 2015-11-18 NOTE — Progress Notes (Signed)
Paul Herring - 75 y.o. male MRN [id number]  Date of birth: 10/21/1940  Office Visit Note: Visit Date: 11/18/2015 PCP: No primary care provider on file. Referred by: No ref. provider found  Subjective: Chief Complaint  Patient presents with  . Lower Back - Pain   HPI: Paul Herring is a 75 year old gentleman seen on a few occasions. He has an MRI from a couple years ago showing multifactorial stenosis moderate to severe at L4-5 with more left-sided disc herniation at the time. He has done well in the past with epidural injections. We haven't seen him in quite sometime he's been doing okay but has had recent flareup and chronic worsening of low back and bilateral hip and leg pain. He states his low back is actually worse than the hip and leg but both given him a lot of grief. It can be random and some days where he pays for doing more activities the day before. He gets a lot of pain with standing and ambulating. He can get a lot of pain in his legs if he walks. He does limit low what he can do. He is using a back brace now on his outside working. He does take tramadol when he needs to use out working. He said no paresthesias or focal weakness. No new trauma. Rates his pain is pretty severe and limiting right now. He is better if he rests. He is better with medication. That is only when he is resting.     Review of Systems  Constitutional: Negative for chills, fever, malaise/fatigue and weight loss.  HENT: Negative for hearing loss and sinus pain.   Eyes: Negative for blurred vision, double vision and photophobia.  Respiratory: Negative for cough and shortness of breath.   Cardiovascular: Negative for chest pain, palpitations and leg swelling.  Gastrointestinal: Negative for abdominal pain, nausea and vomiting.  Genitourinary: Negative for flank pain.  Musculoskeletal: Negative for myalgias.  Skin: Negative for itching and rash.  Neurological: Negative for tremors, focal weakness and  weakness.  Endo/Heme/Allergies: Negative.   Psychiatric/Behavioral: Negative for depression and suicidal ideas.  All other systems reviewed and are negative.  Otherwise per HPI.  Assessment & Plan: Visit Diagnoses:  1. Spinal stenosis of lumbar region with neurogenic claudication   2. Spondylosis without myelopathy or radiculopathy, lumbar region   3. Chronic bilateral low back pain without sciatica     Plan: Findings:  Chronic worsening axial low back pain with referral pattern into the hips and thighs bilaterally. He has some features that are consistent with facet mediated low back pain and some features consistent with his stenosis. He doesn't really appear clinically to have any issues related to herniated disc but she can't rule that out concomitantly. He does wear back brace and told him that spine to do when he is out working for an hour or so but not wear it all day. He is been to physical therapy in the past and he could probably benefit from regrouping with them for core strengthening. He did ask me about yoga when a long discussion on that. I think it would be great for him as long as he avoided extension type poses. We also discussed injection and I think it is time to do at epidural injection. I like to try a bilateral L4 transforaminal injection to see if that does better for him. He will require some Valium preprocedure inches from an anxiety standpoint. We also talked about activity modification. I would not change  his medications at this point. I spent more than 25 minutes speaking face-to-face with the patient with 50% of the time in counseling.    Meds & Orders:  Meds ordered this encounter  Medications  . diazepam (VALIUM) 5 MG tablet    Sig: Take 1 tablet (5 mg total) by mouth 1 day or 1 dose. Take 1 to 2 hours pre-procedure.    Dispense:  2 tablet    Refill:  0   No orders of the defined types were placed in this encounter.   Follow-up: Return for schedule for  bilateral L4 Transforaminal epidural.   Procedures: No procedures performed  No notes on file   Clinical History: Lumbar spine MRI 2015 showed facet arthropathy at L4-5 severe with severe stenosis with disc herniation. At L5-S1 there was right more than left facet hypertrophy with some lateral recess narrowing and right-sided protrusion.  He reports that he quit smoking about 39 years ago. He has never used smokeless tobacco. No results for input(s): HGBA1C, LABURIC in the last 8760 hours.  Objective:  VS:  HT:    WT:   BMI:     BP:123/73  HR:69bpm  TEMP: ( )  RESP:  Physical Exam  Constitutional: He appears well-developed and well-nourished. No distress.  Eyes: Conjunctivae are normal. Pupils are equal, round, and reactive to light.  Cardiovascular: Regular rhythm and intact distal pulses.   Pulmonary/Chest: Effort normal and breath sounds normal.  Musculoskeletal:  The patient ambulates without aid. He is a little slow to rise from a seated position but does have concordant low back pain with extension rotation. He has a negative slump test bilaterally and good distal strength. No pain over the greater trochanters.  Skin: Skin is warm.  Psychiatric: He has a normal mood and affect.    Ortho Exam Imaging: No results found.  Past Medical/Family/Surgical/Social History: Medications & Allergies reviewed per EMR There are no active problems to display for this patient.  History reviewed. No pertinent past medical history. History reviewed. No pertinent family history. History reviewed. No pertinent surgical history. Social History   Occupational History  . Not on file.   Social History Main Topics  . Smoking status: Former Smoker    Quit date: 1978  . Smokeless tobacco: Never Used  . Alcohol use Not on file  . Drug use: Unknown  . Sexual activity: Not on file

## 2015-11-18 NOTE — Patient Instructions (Signed)
Radiofrequency Lesioning Introduction Radiofrequency lesioning is a procedure that is performed to relieve pain. The procedure is often used for back, neck, or arm pain. Radiofrequency lesioning involves the use of a machine that creates radio waves to make heat. During the procedure, the heat is applied to the nerve that carries the pain signal. The heat damages the nerve and interferes with the pain signal. Pain relief usually starts about 2 weeks after the procedure and lasts for 6 months to 1 year. Tell a health care provider about:  Any allergies you have.  All medicines you are taking, including vitamins, herbs, eye drops, creams, and over-the-counter medicines.  Any problems you or family members have had with anesthetic medicines.  Any blood disorders you have.  Any surgeries you have had.  Any medical conditions you have.  Whether you are pregnant or may be pregnant. What are the risks? Generally, this is a safe procedure. However, problems may occur, including:  Pain or soreness at the injection site.  Infection at the injection site.  Damage to nerves or blood vessels. What happens before the procedure?  Ask your health care provider about:  Changing or stopping your regular medicines. This is especially important if you are taking diabetes medicines or blood thinners.  Taking medicines such as aspirin and ibuprofen. These medicines can thin your blood. Do not take these medicines before your procedure if your health care provider instructs you not to.  Follow instructions from your health care provider about eating or drinking restrictions.  Plan to have someone take you home after the procedure.  If you go home right after the procedure, plan to have someone with you for 24 hours. What happens during the procedure?  You will be given one or more of the following:  A medicine to help you relax (sedative).  A medicine to numb the area (local anesthetic).  You  will be awake during the procedure. You will need to be able to talk with the health care provider during the procedure.  With the help of a type of X-ray (fluoroscopy), the health care provider will insert a radiofrequency needle into the area to be treated.  Next, a wire that carries the radio waves (electrode) will be put through the radiofrequency needle. An electrical pulse will be sent through the electrode to verify the correct nerve. You will feel a tingling sensation, and you may have muscle twitching.  Then, the tissue that is around the needle tip will be heated by an electric current that is passed using the radiofrequency machine. This will numb the nerves.  A bandage (dressing) will be put on the insertion area after the procedure is done. The procedure may vary among health care providers and hospitals. What happens after the procedure?  Your blood pressure, heart rate, breathing rate, and blood oxygen level will be monitored often until the medicines you were given have worn off.  Return to your normal activities as directed by your health care provider. This information is not intended to replace advice given to you by your health care provider. Make sure you discuss any questions you have with your health care provider. Document Released: 08/18/2010 Document Revised: 05/28/2015 Document Reviewed: 01/27/2014  2017 [employer]  

## 2015-12-07 ENCOUNTER — Ambulatory Visit (INDEPENDENT_AMBULATORY_CARE_PROVIDER_SITE_OTHER): Payer: Medicare Other | Admitting: Physical Medicine and Rehabilitation

## 2015-12-07 ENCOUNTER — Encounter (INDEPENDENT_AMBULATORY_CARE_PROVIDER_SITE_OTHER): Payer: Self-pay | Admitting: Physical Medicine and Rehabilitation

## 2015-12-07 VITALS — BP 136/85 | HR 63

## 2015-12-07 DIAGNOSIS — M48062 Spinal stenosis, lumbar region with neurogenic claudication: Secondary | ICD-10-CM

## 2015-12-07 DIAGNOSIS — S0502XA Injury of conjunctiva and corneal abrasion without foreign body, left eye, initial encounter: Secondary | ICD-10-CM | POA: Diagnosis not present

## 2015-12-07 MED ORDER — LIDOCAINE HCL (PF) 1 % IJ SOLN
0.3300 mL | Freq: Once | INTRAMUSCULAR | Status: AC
  Administered 2015-12-07: 0.3 mL

## 2015-12-07 MED ORDER — METHYLPREDNISOLONE ACETATE 80 MG/ML IJ SUSP
80.0000 mg | Freq: Once | INTRAMUSCULAR | Status: AC
  Administered 2015-12-07: 80 mg

## 2015-12-07 NOTE — Progress Notes (Signed)
Paul Herring - 75 y.o. male MRN [id number]  Date of birth: 14-Feb-1940  Office Visit Note: Visit Date: 12/07/2015 PCP: Marton Redwood, MD Referred by: No ref. provider found  Subjective: Chief Complaint  Patient presents with  . Lower Back - Pain   HPI: Mr. Paul Herring is a 75 year old gentleman here today for planned Bilateral L4 transforaminal injection. No change in symptoms. He has left more than right symptoms of bilateral low back and radicular pain worse with standing and ambulating. He gets some relief with medications and really is unable to take anti-inflammatories due to kidney problems.    ROS Otherwise per HPI.  Assessment & Plan: Visit Diagnoses:  1. Spinal stenosis of lumbar region with neurogenic claudication     Plan: Findings:  Bilateral L4 transforaminal epidural steroid injection with fluoroscopic guidance. Please see our prior evaluation and management note for further details and justification.    Meds & Orders:  Meds ordered this encounter  Medications  . lidocaine (PF) (XYLOCAINE) 1 % injection 0.3 mL  . methylPREDNISolone acetate (DEPO-MEDROL) injection 80 mg    Orders Placed This Encounter  Procedures  . Epidural Steroid injection    Follow-up: Return if symptoms worsen or fail to improve.   Procedures: No procedures performed  Lumbosacral Transforaminal Epidural Steroid Injection - Infraneural Approach with Fluoroscopic Guidance  Patient: Paul Herring      Date of Birth: 1940/08/02 MRN: [id number] PCP: Marton Redwood, MD      Visit Date: 12/07/2015   Universal Protocol:    Date/Time: 12/06/1709:08 AM  Consent Given By: the patient  Position: PRONE   Additional Comments: Vital signs were monitored before and after the procedure. Patient was prepped and draped in the usual sterile fashion. The correct patient, procedure, and site was verified.   Injection Procedure Details:  Procedure Site One Meds Administered:  Meds ordered this  encounter  Medications  . lidocaine (PF) (XYLOCAINE) 1 % injection 0.3 mL  . methylPREDNISolone acetate (DEPO-MEDROL) injection 80 mg      Laterality: Bilateral  Location/Site:  L4-L5  Needle size: 22 G  Needle type: Spinal  Needle Placement: Transforaminal  Findings:  -Contrast Used: 1 mL iohexol 180 mg iodine/mL   -Comments: Excellent flow of contrast along the nerve and into the epidural space.  Procedure Details: After squaring off the end-plates of the desired vertebral level to get a true AP view, the C-arm was obliqued to the painful side so that the superior articulating process is positioned about 1/3 the length of the inferior endplate.  The needle was aimed toward the junction of the superior articular process and the transverse process of the inferior vertebrae. The needle's initial entry is in the lower third of the foramen through Kambin's triangle. The soft tissues overlying this target were infiltrated with 2-3 ml. of 1% Lidocaine without Epinephrine.  The spinal needle was then inserted and advanced toward the target using a "trajectory" view along the fluoroscope beam.  Under AP and lateral visualization, the needle was advanced so it did not puncture dura and did not traverse medially beyond the 6 o'clock position of the pedicle. Bi-planar projections were used to confirm position. Aspiration was confirmed to be negative for CSF and/or blood. A 1-2 ml. volume of Isovue-250 was injected and flow of contrast was noted at each level. Radiographs were obtained for documentation purposes.   After attaining the desired flow of contrast documented above, a 0.5 to 1.0 ml test dose of 0.25% Marcaine was injected  into each respective transforaminal space.  The patient was observed for 90 seconds post injection.  After no sensory deficits were reported, and normal lower extremity motor function was noted,   the above injectate was administered so that equal amounts of the injectate  were placed at each foramen (level) into the transforaminal epidural space.   Additional Comments:  The patient tolerated the procedure well Dressing: Band-Aid    Post-procedure details: Patient was observed during the procedure. Post-procedure instructions were reviewed.  Patient left the clinic in stable condition.   Clinical History: Lumbar spine MRI 2015 showed facet arthropathy at L4-5 severe with severe stenosis with disc herniation. At L5-S1 there was right more than left facet hypertrophy with some lateral recess narrowing and right-sided protrusion.  He reports that he quit smoking about 39 years ago. He has never used smokeless tobacco. No results for input(s): HGBA1C, LABURIC in the last 8760 hours.  Objective:  VS:  HT:    WT:   BMI:     BP:136/85  HR:63bpm  TEMP: ( )  RESP:97 % Physical Exam  Musculoskeletal:  The patient ambulates without aid with good distal strength.    Ortho Exam Imaging: No results found.  Past Medical/Family/Surgical/Social History: Medications & Allergies reviewed per EMR There are no active problems to display for this patient.  History reviewed. No pertinent past medical history. History reviewed. No pertinent family history. History reviewed. No pertinent surgical history. Social History   Occupational History  . Not on file.   Social History Main Topics  . Smoking status: Former Smoker    Quit date: 1978  . Smokeless tobacco: Never Used  . Alcohol use Not on file  . Drug use: Unknown  . Sexual activity: Not on file

## 2015-12-07 NOTE — Procedures (Signed)
Lumbosacral Transforaminal Epidural Steroid Injection - Infraneural Approach with Fluoroscopic Guidance  Patient: Paul Herring      Date of Birth: [birth year] MRN: [id number] PCP: Marton Redwood, MD      Visit Date: 12/07/2015   Universal Protocol:    Date/Time: 12/06/1709:08 AM  Consent Given By: the patient  Position: PRONE   Additional Comments: Vital signs were monitored before and after the procedure. Patient was prepped and draped in the usual sterile fashion. The correct patient, procedure, and site was verified.   Injection Procedure Details:  Procedure Site One Meds Administered:  Meds ordered this encounter  Medications  . lidocaine (PF) (XYLOCAINE) 1 % injection 0.3 mL  . methylPREDNISolone acetate (DEPO-MEDROL) injection 80 mg      Laterality: Bilateral  Location/Site:  L4-L5  Needle size: 22 G  Needle type: Spinal  Needle Placement: Transforaminal  Findings:  -Contrast Used: 1 mL iohexol 180 mg iodine/mL   -Comments: Excellent flow of contrast along the nerve and into the epidural space.  Procedure Details: After squaring off the end-plates of the desired vertebral level to get a true AP view, the C-arm was obliqued to the painful side so that the superior articulating process is positioned about 1/3 the length of the inferior endplate.  The needle was aimed toward the junction of the superior articular process and the transverse process of the inferior vertebrae. The needle's initial entry is in the lower third of the foramen through Kambin's triangle. The soft tissues overlying this target were infiltrated with 2-3 ml. of 1% Lidocaine without Epinephrine.  The spinal needle was then inserted and advanced toward the target using a "trajectory" view along the fluoroscope beam.  Under AP and lateral visualization, the needle was advanced so it did not puncture dura and did not traverse medially beyond the 6 o'clock position of the pedicle. Bi-planar  projections were used to confirm position. Aspiration was confirmed to be negative for CSF and/or blood. A 1-2 ml. volume of Isovue-250 was injected and flow of contrast was noted at each level. Radiographs were obtained for documentation purposes.   After attaining the desired flow of contrast documented above, a 0.5 to 1.0 ml test dose of 0.25% Marcaine was injected into each respective transforaminal space.  The patient was observed for 90 seconds post injection.  After no sensory deficits were reported, and normal lower extremity motor function was noted,   the above injectate was administered so that equal amounts of the injectate were placed at each foramen (level) into the transforaminal epidural space.   Additional Comments:  The patient tolerated the procedure well Dressing: Band-Aid    Post-procedure details: Patient was observed during the procedure. Post-procedure instructions were reviewed.  Patient left the clinic in stable condition.

## 2015-12-07 NOTE — Patient Instructions (Signed)

## 2015-12-14 DIAGNOSIS — R7301 Impaired fasting glucose: Secondary | ICD-10-CM | POA: Diagnosis not present

## 2015-12-14 DIAGNOSIS — E784 Other hyperlipidemia: Secondary | ICD-10-CM | POA: Diagnosis not present

## 2015-12-14 DIAGNOSIS — M109 Gout, unspecified: Secondary | ICD-10-CM | POA: Diagnosis not present

## 2015-12-14 DIAGNOSIS — I10 Essential (primary) hypertension: Secondary | ICD-10-CM | POA: Diagnosis not present

## 2015-12-21 DIAGNOSIS — N183 Chronic kidney disease, stage 3 (moderate): Secondary | ICD-10-CM | POA: Diagnosis not present

## 2015-12-21 DIAGNOSIS — R972 Elevated prostate specific antigen [PSA]: Secondary | ICD-10-CM | POA: Diagnosis not present

## 2015-12-21 DIAGNOSIS — R7301 Impaired fasting glucose: Secondary | ICD-10-CM | POA: Diagnosis not present

## 2015-12-21 DIAGNOSIS — Z1389 Encounter for screening for other disorder: Secondary | ICD-10-CM | POA: Diagnosis not present

## 2015-12-21 DIAGNOSIS — M109 Gout, unspecified: Secondary | ICD-10-CM | POA: Diagnosis not present

## 2015-12-21 DIAGNOSIS — Z0000 Encounter for general adult medical examination without abnormal findings: Secondary | ICD-10-CM | POA: Diagnosis not present

## 2015-12-21 DIAGNOSIS — M5416 Radiculopathy, lumbar region: Secondary | ICD-10-CM | POA: Diagnosis not present

## 2015-12-21 DIAGNOSIS — I10 Essential (primary) hypertension: Secondary | ICD-10-CM | POA: Diagnosis not present

## 2015-12-21 DIAGNOSIS — N401 Enlarged prostate with lower urinary tract symptoms: Secondary | ICD-10-CM | POA: Diagnosis not present

## 2015-12-21 DIAGNOSIS — Z6831 Body mass index (BMI) 31.0-31.9, adult: Secondary | ICD-10-CM | POA: Diagnosis not present

## 2015-12-21 DIAGNOSIS — E784 Other hyperlipidemia: Secondary | ICD-10-CM | POA: Diagnosis not present

## 2015-12-21 DIAGNOSIS — Z86010 Personal history of colonic polyps: Secondary | ICD-10-CM | POA: Diagnosis not present

## 2016-02-08 DIAGNOSIS — Z1211 Encounter for screening for malignant neoplasm of colon: Secondary | ICD-10-CM | POA: Diagnosis not present

## 2016-02-08 DIAGNOSIS — Z1212 Encounter for screening for malignant neoplasm of rectum: Secondary | ICD-10-CM | POA: Diagnosis not present

## 2016-02-09 IMAGING — MR MR LUMBAR SPINE W/O CM
4 of 6 series · 25 of 48 positions shown · non-contrast
Comparison: None.

CLINICAL DATA: Low back pain. Pain extending into the lower
extremities bilaterally.

EXAM:
MRI LUMBAR SPINE WITHOUT CONTRAST
TECHNIQUE: Multiplanar, multisequence MR imaging of the lumbar spine was
performed. No intravenous contrast was administered.

[Series 3: T2 · sagittal · 4.0mm · 0.49mm/px · 6 of 15 slices shown (1 of 3)]
[im 1/15]
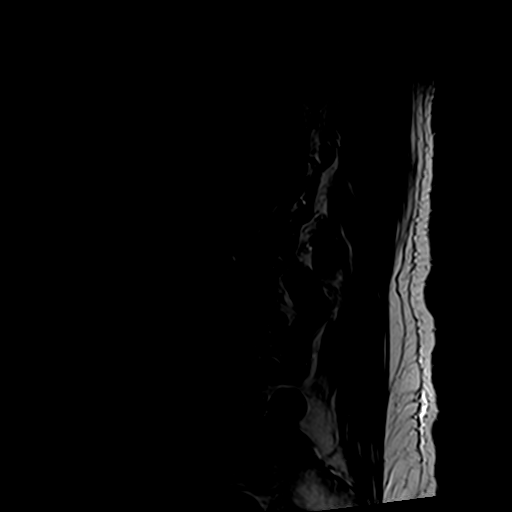
[im 3/15]
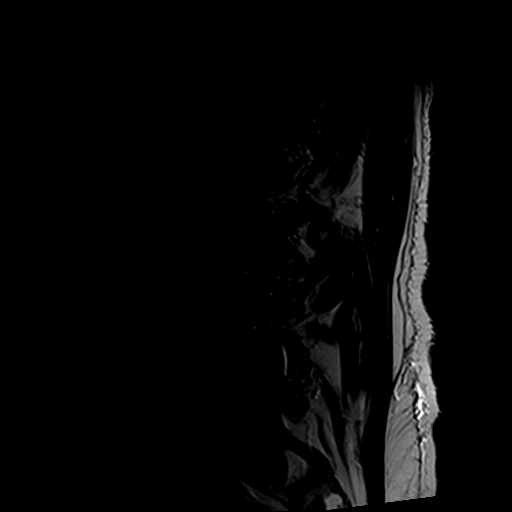
[im 6/15]
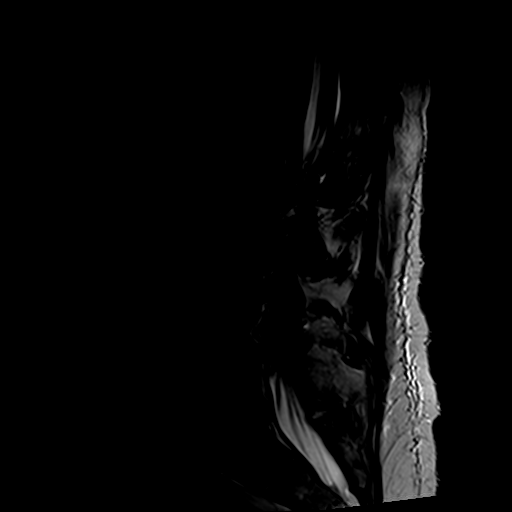
[im 9/15]
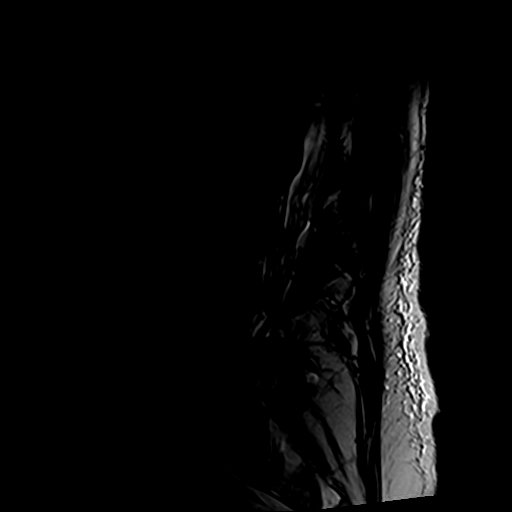
[im 12/15]
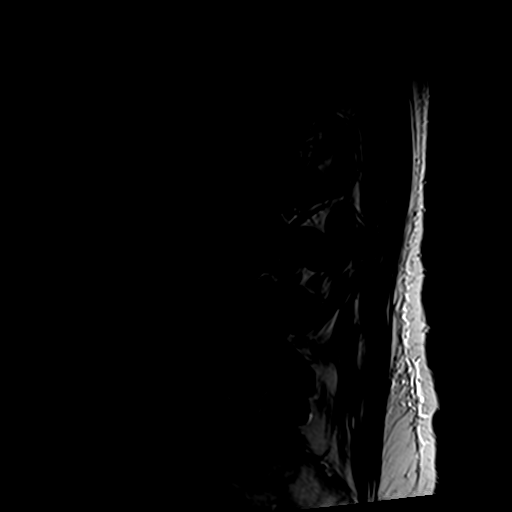
[im 15/15]
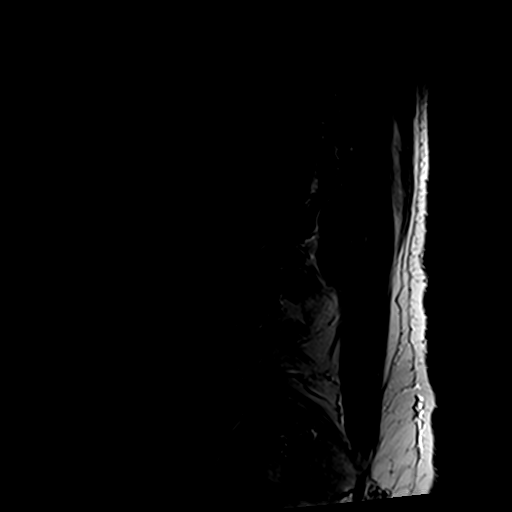

[Series 4: T1 · sagittal · 4.0mm · 0.49mm/px · 5 of 15 slices shown]
[im 1/15]
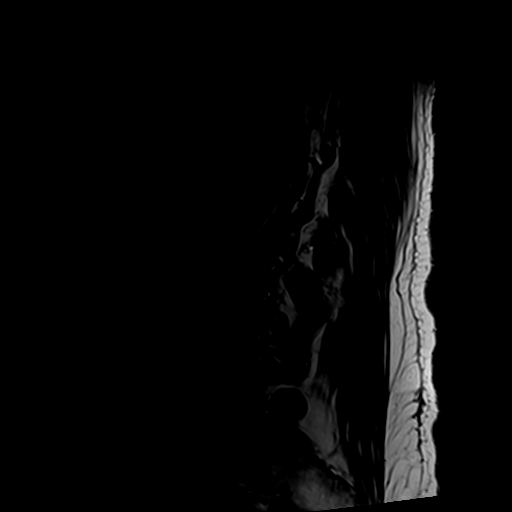
[im 3/15]
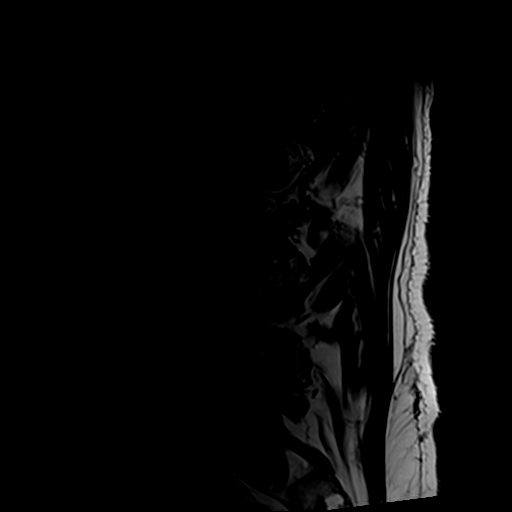
[im 6/15]
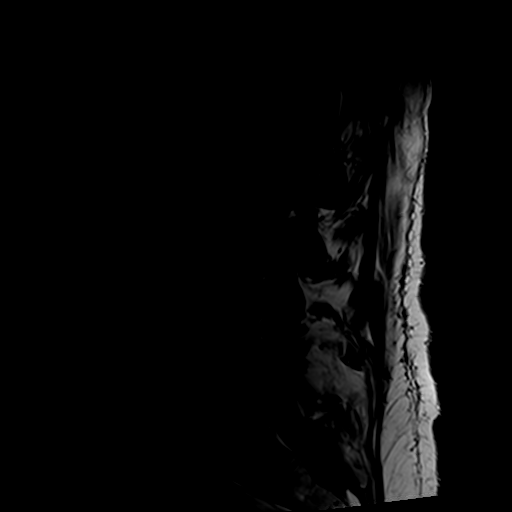
[im 9/15]
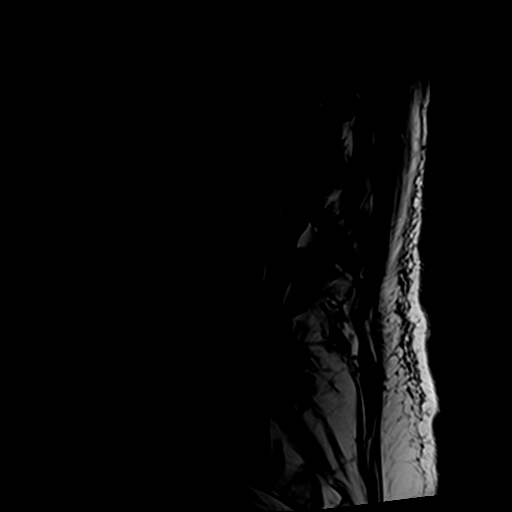
[im 15/15]
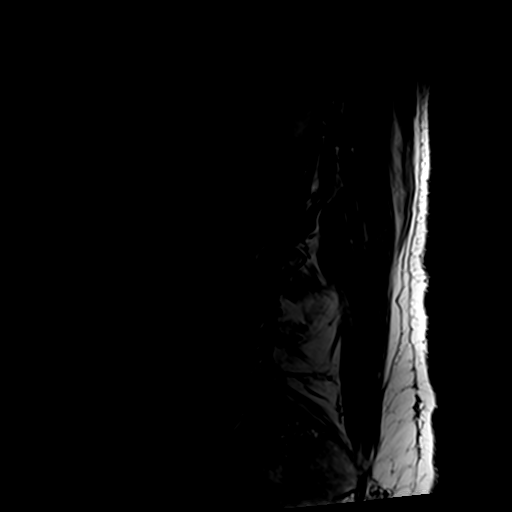

[Series 6: T2 · axial · 4.0mm · 0.61mm/px · z∈[-89,+96]mm · 9 of 38 slices shown (2 of 3)]
[im 1/38]
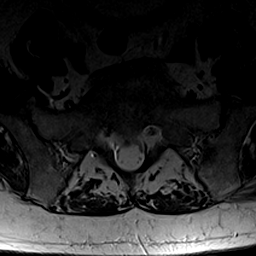
[im 7/38]
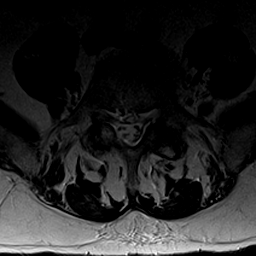
[im 13/38]
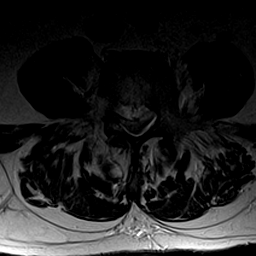
[im 16/38]
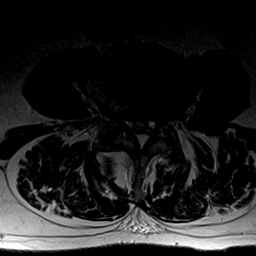
[im 19/38]
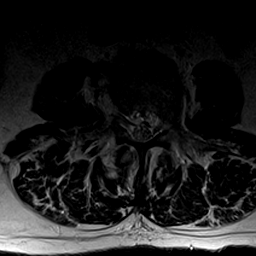
[im 22/38]
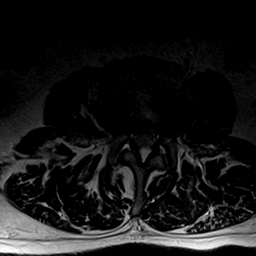
[im 25/38]
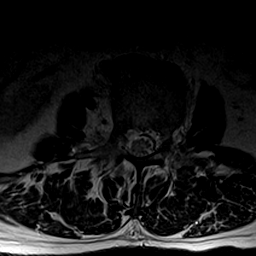
[im 31/38]
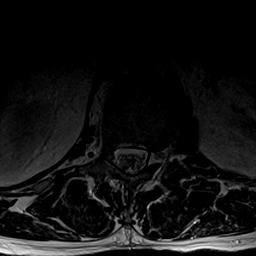
[im 38/38]
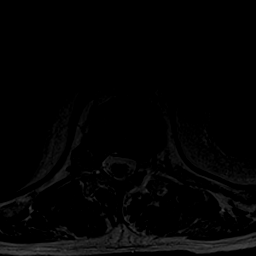

[Series 8: T2 · coronal · 4.0mm · 0.49mm/px · 5 of 15 slices shown (3 of 3)]
[im 1/15]
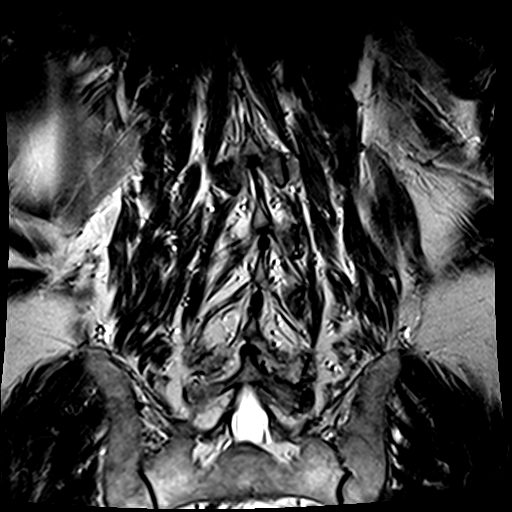
[im 4/15]
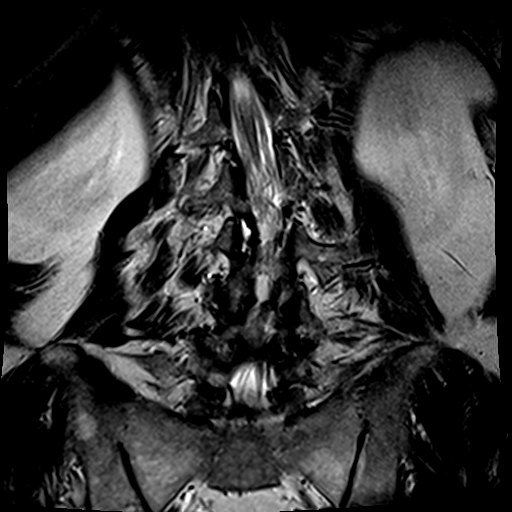
[im 8/15]
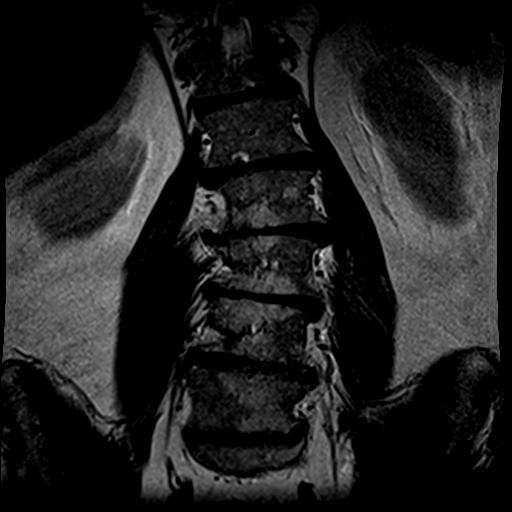
[im 11/15]
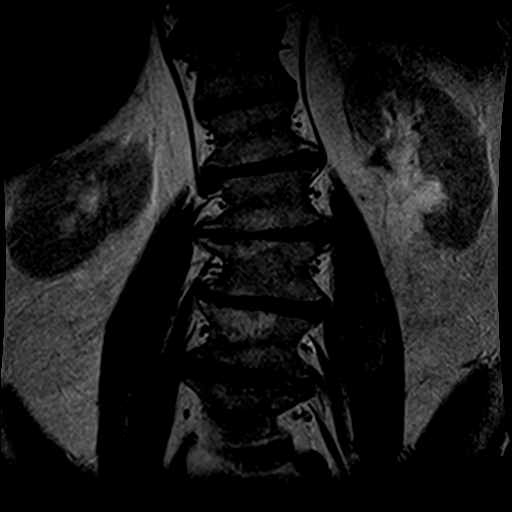
[im 15/15]
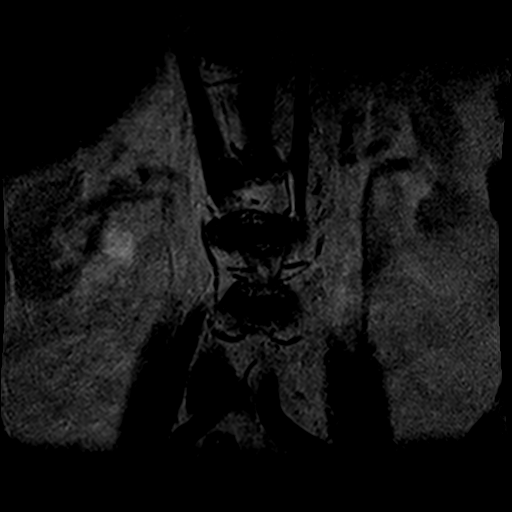

[25 of 48 positions shown; findings below may reference images not displayed]

FINDINGS: Leftward curvature of the lumbar spine is centered at L3. Chronic
endplate marrow changes are present from L1-2 through L5-S1, more
pronounced on the right except at L5-S1 were changes are more
prominent on the left.

Limited imaging of the abdomen is unremarkable.

Normal signal is present in the conus medullaris which terminates at
L1.

L1-2: Mild disc bulging is asymmetric to the left. There is no
significant stenosis.

L2-3: A broad-based disc herniation is present. Mild facet
hypertrophy is noted bilaterally. This results in mild lateral
recess narrowing bilaterally.

L3-4: A broad-based disc herniation is present. Moderate facet
hypertrophy is noted. Mild lateral recess and foraminal narrowing is
present bilaterally.

L4-5: This is the most severe level. A leftward disc protrusion is
present. Moderate facet hypertrophy is present bilaterally. This
results in severe central canal stenosis, left greater than right.
Moderate left and mild right foraminal stenosis is present
bilaterally.

L5-S1: A rightward disc protrusion is present. Asymmetric
right-sided facet hypertrophy is noted. This results in mild right
foraminal narrowing. The central canal and left foramen are patent.
IMPRESSION: 1. Severe central canal stenosis at L4-5 secondary to a low left
paramedian disc protrusion and moderate facet hypertrophy.
2. Moderate left and mild right foraminal stenosis at L4-5.
3. Levoconvex curvature of the lumbar spine is centered at L3.
4. Mild lateral recess and foraminal narrowing bilaterally at L2-3
and L3-4.
5. Mild right foraminal narrowing at L5-S1

## 2016-02-24 ENCOUNTER — Encounter: Payer: Self-pay | Admitting: Internal Medicine

## 2016-03-08 DIAGNOSIS — H1045 Other chronic allergic conjunctivitis: Secondary | ICD-10-CM | POA: Diagnosis not present

## 2016-03-08 DIAGNOSIS — H353130 Nonexudative age-related macular degeneration, bilateral, stage unspecified: Secondary | ICD-10-CM | POA: Diagnosis not present

## 2016-03-08 DIAGNOSIS — H04123 Dry eye syndrome of bilateral lacrimal glands: Secondary | ICD-10-CM | POA: Diagnosis not present

## 2016-03-08 DIAGNOSIS — H5213 Myopia, bilateral: Secondary | ICD-10-CM | POA: Diagnosis not present

## 2016-03-08 DIAGNOSIS — Z961 Presence of intraocular lens: Secondary | ICD-10-CM | POA: Diagnosis not present

## 2016-03-08 DIAGNOSIS — H52223 Regular astigmatism, bilateral: Secondary | ICD-10-CM | POA: Diagnosis not present

## 2016-04-07 ENCOUNTER — Ambulatory Visit (AMBULATORY_SURGERY_CENTER): Payer: Self-pay

## 2016-04-07 VITALS — Ht 69.0 in | Wt 217.6 lb

## 2016-04-07 DIAGNOSIS — Z86010 Personal history of colonic polyps: Secondary | ICD-10-CM

## 2016-04-07 MED ORDER — NA SULFATE-K SULFATE-MG SULF 17.5-3.13-1.6 GM/177ML PO SOLN: 1.0000 | Freq: Once | ORAL | 0 refills | Status: AC

## 2016-04-07 NOTE — Progress Notes (Signed)
Denies allergies to eggs or soy products. Denies complication of anesthesia or sedation. Denies use of weight loss medication. Denies use of O2.   Emmi instructions given for colonoscopy.  

## 2016-04-20 ENCOUNTER — Encounter: Payer: Self-pay | Admitting: Internal Medicine

## 2016-04-20 ENCOUNTER — Ambulatory Visit (AMBULATORY_SURGERY_CENTER): Payer: Medicare Other | Admitting: Internal Medicine

## 2016-04-20 VITALS — BP 111/66 | HR 58 | Temp 98.6°F | Resp 17 | Ht 69.0 in | Wt 217.0 lb

## 2016-04-20 DIAGNOSIS — D122 Benign neoplasm of ascending colon: Secondary | ICD-10-CM | POA: Diagnosis not present

## 2016-04-20 DIAGNOSIS — Z86010 Personal history of colonic polyps: Secondary | ICD-10-CM | POA: Diagnosis not present

## 2016-04-20 DIAGNOSIS — D120 Benign neoplasm of cecum: Secondary | ICD-10-CM | POA: Diagnosis not present

## 2016-04-20 DIAGNOSIS — D125 Benign neoplasm of sigmoid colon: Secondary | ICD-10-CM

## 2016-04-20 DIAGNOSIS — D123 Benign neoplasm of transverse colon: Secondary | ICD-10-CM

## 2016-04-20 DIAGNOSIS — K635 Polyp of colon: Secondary | ICD-10-CM | POA: Diagnosis not present

## 2016-04-20 MED ORDER — SODIUM CHLORIDE 0.9 % IV SOLN: 500.0000 mL | INTRAVENOUS | Status: AC

## 2016-04-20 MED ORDER — DEXTROSE 5 % IV SOLN: INTRAVENOUS | Status: AC

## 2016-04-20 NOTE — Op Note (Signed)
[facility] Patient Name: Paul Herring Procedure Date: 04/20/2016 2:17 PM MRN: [id number] Endoscopist: Docia Chuck. Henrene Pastor , MD Age: 76 Referring MD:  Date of Birth: 09/11/1940 Gender: Male Account #: [id number] Procedure:                Colonoscopy with cold snare polypectomy x 7 Indications:              High risk colon cancer surveillance: Personal                            history of multiple (3 or more) adenomas on index                            exam 2011. Overdue for follow up (patient                            preference). Had positive Cologuard 02-2016 Medicines:                Monitored Anesthesia Care Procedure:                Pre-Anesthesia Assessment:                           - Prior to the procedure, a History and Physical                            was performed, and patient medications and                            allergies were reviewed. The patient's tolerance of                            previous anesthesia was also reviewed. The risks                            and benefits of the procedure and the sedation                            options and risks were discussed with the patient.                            All questions were answered, and informed consent                            was obtained. Prior Anticoagulants: The patient has                            taken no previous anticoagulant or antiplatelet                            agents. ASA Grade Assessment: II - A patient with                            mild systemic disease. After reviewing the risks  and benefits, the patient was deemed in                            satisfactory condition to undergo the procedure.                           After obtaining informed consent, the colonoscope                            was passed under direct vision. Throughout the                            procedure, the patient's blood pressure, pulse, and   oxygen saturations were monitored continuously. The                            Colonoscope was introduced through the anus and                            advanced to the the cecum, identified by                            appendiceal orifice and ileocecal valve. The                            ileocecal valve, appendiceal orifice, and rectum                            were photographed. The quality of the bowel                            preparation was good. The colonoscopy was performed                            without difficulty. The patient tolerated the                            procedure well. The bowel preparation used was                            SUPREP. Scope In: 2:24:26 PM Scope Out: 2:48:52 PM Scope Withdrawal Time: 0 hours 21 minutes 31 seconds  Total Procedure Duration: 0 hours 24 minutes 26 seconds  Findings:                 Seven polyps were found in the sigmoid colon,                            transverse colon, ascending colon and cecum. The                            polyps were 2 to 5 mm in size. These polyps were                            removed with a  cold snare. Resection and retrieval                            were complete.                           Multiple small and large-mouthed diverticula were                            found in the left colon.                           Internal hemorrhoids were found during retroflexion.                           The exam was otherwise without abnormality on                            direct and retroflexion views. Complications:            No immediate complications. Estimated blood loss:                            None. Estimated Blood Loss:     Estimated blood loss: none. Impression:               - Seven 2 to 5 mm polyps in the sigmoid colon, in                            the transverse colon, in the ascending colon and in                            the cecum, removed with a cold snare. Resected and                             retrieved.                           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                           - Internal hemorrhoids.                           - The examination was otherwise normal on direct                            and retroflexion views. Recommendation:           - Repeat colonoscopy in 3 or 5 years for                            surveillance, pending path.                           - Patient has a contact number available for  emergencies. The signs and symptoms of potential                            delayed complications were discussed with the                            patient. Return to normal activities tomorrow.                            Written discharge instructions were provided to the                            patient.                           - Resume previous diet.                           - Continue present medications.                           - Await pathology results. Docia Chuck. Henrene Pastor, MD 04/20/2016 3:02:25 PM This report has been signed electronically.

## 2016-04-20 NOTE — Progress Notes (Signed)
A and O x3. Report to RN. Tolerated MAC anesthesia well.

## 2016-04-20 NOTE — Progress Notes (Signed)
Pt's states no medical or surgical changes since previsit or office visit. 

## 2016-04-20 NOTE — Patient Instructions (Signed)
YOU HAD AN ENDOSCOPIC PROCEDURE TODAY AT THE [facility]:   Refer to the procedure report that was given to you for any specific questions about what was found during the examination.  If the procedure report does not answer your questions, please call your gastroenterologist to clarify.  If you requested that your care partner not be given the details of your procedure findings, then the procedure report has been included in a sealed envelope for you to review at your convenience later.  YOU SHOULD EXPECT: Some feelings of bloating in the abdomen. Passage of more gas than usual.  Walking can help get rid of the air that was put into your GI tract during the procedure and reduce the bloating. If you had a lower endoscopy (such as a colonoscopy or flexible sigmoidoscopy) you may notice spotting of blood in your stool or on the toilet paper. If you underwent a bowel prep for your procedure, you may not have a normal bowel movement for a few days.  Please Note:  You might notice some irritation and congestion in your nose or some drainage.  This is from the oxygen used during your procedure.  There is no need for concern and it should clear up in a day or so.  SYMPTOMS TO REPORT IMMEDIATELY:   Following lower endoscopy (colonoscopy or flexible sigmoidoscopy):  Excessive amounts of blood in the stool  Significant tenderness or worsening of abdominal pains  Swelling of the abdomen that is new, acute  Fever of 100F or higher  For urgent or emergent issues, a gastroenterologist can be reached at any hour by calling [phone].   DIET:  We do recommend a small meal at first, but then you may proceed to your regular diet.  Drink plenty of fluids but you should avoid alcoholic beverages for 24 hours.  MEDICATIONS: Continue present medications.  Please see handouts given to you by your recovery nurse.  ACTIVITY:  You should plan to take it easy for the rest of today and you should NOT  DRIVE or use heavy machinery until tomorrow (because of the sedation medicines used during the test).    FOLLOW UP: Our staff will call the number listed on your records the next business day following your procedure to check on you and address any questions or concerns that you may have regarding the information given to you following your procedure. If we do not reach you, we will leave a message.  However, if you are feeling well and you are not experiencing any problems, there is no need to return our call.  We will assume that you have returned to your regular daily activities without incident.  If any biopsies were taken you will be contacted by phone or by letter within the next 1-3 weeks.  Please call us at [phone] if you have not heard about the biopsies in 3 weeks.   Thank you for allowing us to provide for your healthcare needs today.   SIGNATURES/CONFIDENTIALITY: You and/or your care partner have signed paperwork which will be entered into your electronic medical record.  These signatures attest to the fact that that the information above on your After Visit Summary has been reviewed and is understood.  Full responsibility of the confidentiality of this discharge information lies with you and/or your care-partner. 

## 2016-04-20 NOTE — Progress Notes (Signed)
Called to room to assist during endoscopic procedure.  Patient ID and intended procedure confirmed with present staff. Received instructions for my participation in the procedure from the performing physician.  

## 2016-04-21 ENCOUNTER — Telehealth: Payer: Self-pay | Admitting: *Deleted

## 2016-04-21 NOTE — Telephone Encounter (Signed)
  Follow up Call-  Call back number 04/20/2016  Post procedure Call Back phone  # [phone]  Permission to leave phone message Yes  Some recent data might be hidden     Patient questions:  Do you have a fever, pain , or abdominal swelling? No. Pain Score  0 *  Have you tolerated food without any problems? Yes.    Have you been able to return to your normal activities? Yes.    Do you have any questions about your discharge instructions: Diet   No. Medications  No. Follow up visit  No.  Do you have questions or concerns about your Care? No.  Actions: * If pain score is 4 or above: No action needed, pain <4.

## 2016-04-26 ENCOUNTER — Encounter: Payer: Self-pay | Admitting: Internal Medicine

## 2016-05-26 DIAGNOSIS — Z6830 Body mass index (BMI) 30.0-30.9, adult: Secondary | ICD-10-CM | POA: Diagnosis not present

## 2016-05-26 DIAGNOSIS — M436 Torticollis: Secondary | ICD-10-CM | POA: Diagnosis not present

## 2016-05-26 DIAGNOSIS — I10 Essential (primary) hypertension: Secondary | ICD-10-CM | POA: Diagnosis not present

## 2016-05-26 DIAGNOSIS — M5416 Radiculopathy, lumbar region: Secondary | ICD-10-CM | POA: Diagnosis not present

## 2016-05-26 DIAGNOSIS — M62830 Muscle spasm of back: Secondary | ICD-10-CM | POA: Diagnosis not present

## 2016-09-30 DIAGNOSIS — Z23 Encounter for immunization: Secondary | ICD-10-CM | POA: Diagnosis not present

## 2016-10-27 DIAGNOSIS — R972 Elevated prostate specific antigen [PSA]: Secondary | ICD-10-CM | POA: Diagnosis not present

## 2016-10-27 DIAGNOSIS — N400 Enlarged prostate without lower urinary tract symptoms: Secondary | ICD-10-CM | POA: Diagnosis not present

## 2017-01-12 DIAGNOSIS — E7849 Other hyperlipidemia: Secondary | ICD-10-CM | POA: Diagnosis not present

## 2017-01-12 DIAGNOSIS — R82998 Other abnormal findings in urine: Secondary | ICD-10-CM | POA: Diagnosis not present

## 2017-01-12 DIAGNOSIS — R7301 Impaired fasting glucose: Secondary | ICD-10-CM | POA: Diagnosis not present

## 2017-01-12 DIAGNOSIS — Z125 Encounter for screening for malignant neoplasm of prostate: Secondary | ICD-10-CM | POA: Diagnosis not present

## 2017-01-12 DIAGNOSIS — I10 Essential (primary) hypertension: Secondary | ICD-10-CM | POA: Diagnosis not present

## 2017-01-12 DIAGNOSIS — M1000 Idiopathic gout, unspecified site: Secondary | ICD-10-CM | POA: Diagnosis not present

## 2017-01-20 DIAGNOSIS — Z6831 Body mass index (BMI) 31.0-31.9, adult: Secondary | ICD-10-CM | POA: Diagnosis not present

## 2017-01-20 DIAGNOSIS — R7301 Impaired fasting glucose: Secondary | ICD-10-CM | POA: Diagnosis not present

## 2017-01-20 DIAGNOSIS — K219 Gastro-esophageal reflux disease without esophagitis: Secondary | ICD-10-CM | POA: Diagnosis not present

## 2017-01-20 DIAGNOSIS — Z86010 Personal history of colonic polyps: Secondary | ICD-10-CM | POA: Diagnosis not present

## 2017-01-20 DIAGNOSIS — N401 Enlarged prostate with lower urinary tract symptoms: Secondary | ICD-10-CM | POA: Diagnosis not present

## 2017-01-20 DIAGNOSIS — M1000 Idiopathic gout, unspecified site: Secondary | ICD-10-CM | POA: Diagnosis not present

## 2017-01-20 DIAGNOSIS — Z1389 Encounter for screening for other disorder: Secondary | ICD-10-CM | POA: Diagnosis not present

## 2017-01-20 DIAGNOSIS — I10 Essential (primary) hypertension: Secondary | ICD-10-CM | POA: Diagnosis not present

## 2017-01-20 DIAGNOSIS — N183 Chronic kidney disease, stage 3 (moderate): Secondary | ICD-10-CM | POA: Diagnosis not present

## 2017-01-20 DIAGNOSIS — R972 Elevated prostate specific antigen [PSA]: Secondary | ICD-10-CM | POA: Diagnosis not present

## 2017-01-20 DIAGNOSIS — E7849 Other hyperlipidemia: Secondary | ICD-10-CM | POA: Diagnosis not present

## 2017-01-20 DIAGNOSIS — Z0000 Encounter for general adult medical examination without abnormal findings: Secondary | ICD-10-CM | POA: Diagnosis not present

## 2017-04-27 DIAGNOSIS — H353121 Nonexudative age-related macular degeneration, left eye, early dry stage: Secondary | ICD-10-CM | POA: Diagnosis not present

## 2017-04-27 DIAGNOSIS — H04123 Dry eye syndrome of bilateral lacrimal glands: Secondary | ICD-10-CM | POA: Diagnosis not present

## 2017-04-27 DIAGNOSIS — H5212 Myopia, left eye: Secondary | ICD-10-CM | POA: Diagnosis not present

## 2017-04-27 DIAGNOSIS — H52222 Regular astigmatism, left eye: Secondary | ICD-10-CM | POA: Diagnosis not present

## 2017-04-27 DIAGNOSIS — Z961 Presence of intraocular lens: Secondary | ICD-10-CM | POA: Diagnosis not present

## 2017-04-27 DIAGNOSIS — H353111 Nonexudative age-related macular degeneration, right eye, early dry stage: Secondary | ICD-10-CM | POA: Diagnosis not present

## 2017-04-27 DIAGNOSIS — H353131 Nonexudative age-related macular degeneration, bilateral, early dry stage: Secondary | ICD-10-CM | POA: Diagnosis not present

## 2017-10-03 DIAGNOSIS — Z23 Encounter for immunization: Secondary | ICD-10-CM | POA: Diagnosis not present

## 2017-10-10 DIAGNOSIS — R972 Elevated prostate specific antigen [PSA]: Secondary | ICD-10-CM | POA: Diagnosis not present

## 2017-10-17 DIAGNOSIS — R351 Nocturia: Secondary | ICD-10-CM | POA: Diagnosis not present

## 2017-10-17 DIAGNOSIS — R972 Elevated prostate specific antigen [PSA]: Secondary | ICD-10-CM | POA: Diagnosis not present

## 2017-10-17 DIAGNOSIS — N401 Enlarged prostate with lower urinary tract symptoms: Secondary | ICD-10-CM | POA: Diagnosis not present

## 2017-11-06 DIAGNOSIS — S0501XA Injury of conjunctiva and corneal abrasion without foreign body, right eye, initial encounter: Secondary | ICD-10-CM | POA: Diagnosis not present

## 2018-01-18 DIAGNOSIS — M109 Gout, unspecified: Secondary | ICD-10-CM | POA: Diagnosis not present

## 2018-01-18 DIAGNOSIS — E7849 Other hyperlipidemia: Secondary | ICD-10-CM | POA: Diagnosis not present

## 2018-01-18 DIAGNOSIS — I10 Essential (primary) hypertension: Secondary | ICD-10-CM | POA: Diagnosis not present

## 2018-01-18 DIAGNOSIS — R7301 Impaired fasting glucose: Secondary | ICD-10-CM | POA: Diagnosis not present

## 2018-01-18 DIAGNOSIS — R82998 Other abnormal findings in urine: Secondary | ICD-10-CM | POA: Diagnosis not present

## 2018-01-25 DIAGNOSIS — I10 Essential (primary) hypertension: Secondary | ICD-10-CM | POA: Diagnosis not present

## 2018-01-25 DIAGNOSIS — Z1339 Encounter for screening examination for other mental health and behavioral disorders: Secondary | ICD-10-CM | POA: Diagnosis not present

## 2018-01-25 DIAGNOSIS — Z1331 Encounter for screening for depression: Secondary | ICD-10-CM | POA: Diagnosis not present

## 2018-01-25 DIAGNOSIS — R0989 Other specified symptoms and signs involving the circulatory and respiratory systems: Secondary | ICD-10-CM | POA: Diagnosis not present

## 2018-01-25 DIAGNOSIS — Z6832 Body mass index (BMI) 32.0-32.9, adult: Secondary | ICD-10-CM | POA: Diagnosis not present

## 2018-01-25 DIAGNOSIS — E7849 Other hyperlipidemia: Secondary | ICD-10-CM | POA: Diagnosis not present

## 2018-01-25 DIAGNOSIS — Z0000 Encounter for general adult medical examination without abnormal findings: Secondary | ICD-10-CM | POA: Diagnosis not present

## 2018-01-25 DIAGNOSIS — R7301 Impaired fasting glucose: Secondary | ICD-10-CM | POA: Diagnosis not present

## 2018-01-25 DIAGNOSIS — N183 Chronic kidney disease, stage 3 (moderate): Secondary | ICD-10-CM | POA: Diagnosis not present

## 2018-01-25 DIAGNOSIS — N401 Enlarged prostate with lower urinary tract symptoms: Secondary | ICD-10-CM | POA: Diagnosis not present

## 2018-01-25 DIAGNOSIS — M5416 Radiculopathy, lumbar region: Secondary | ICD-10-CM | POA: Diagnosis not present

## 2018-01-25 DIAGNOSIS — M109 Gout, unspecified: Secondary | ICD-10-CM | POA: Diagnosis not present

## 2018-01-26 DIAGNOSIS — Z1212 Encounter for screening for malignant neoplasm of rectum: Secondary | ICD-10-CM | POA: Diagnosis not present

## 2018-01-31 ENCOUNTER — Ambulatory Visit (HOSPITAL_COMMUNITY)
Admission: RE | Admit: 2018-01-31 | Discharge: 2018-01-31 | Disposition: A | Discharge status: Home / Self Care | Payer: Medicare Other | Source: Ambulatory Visit | Attending: Family | Admitting: Family

## 2018-01-31 ENCOUNTER — Other Ambulatory Visit (HOSPITAL_COMMUNITY): Payer: Self-pay | Admitting: Internal Medicine

## 2018-01-31 DIAGNOSIS — R0989 Other specified symptoms and signs involving the circulatory and respiratory systems: Secondary | ICD-10-CM | POA: Diagnosis not present

## 2018-02-08 ENCOUNTER — Telehealth (INDEPENDENT_AMBULATORY_CARE_PROVIDER_SITE_OTHER): Payer: Self-pay | Admitting: Physical Medicine and Rehabilitation

## 2018-02-08 NOTE — Telephone Encounter (Signed)
Per automated voice [employer] no pa is needed medicare for [phone]

## 2018-02-15 ENCOUNTER — Ambulatory Visit (INDEPENDENT_AMBULATORY_CARE_PROVIDER_SITE_OTHER): Payer: Self-pay

## 2018-02-15 ENCOUNTER — Ambulatory Visit (INDEPENDENT_AMBULATORY_CARE_PROVIDER_SITE_OTHER): Payer: Medicare Other | Admitting: Physical Medicine and Rehabilitation

## 2018-02-15 ENCOUNTER — Encounter (INDEPENDENT_AMBULATORY_CARE_PROVIDER_SITE_OTHER): Payer: Self-pay | Admitting: Physical Medicine and Rehabilitation

## 2018-02-15 VITALS — BP 150/94 | HR 69 | Temp 97.8°F

## 2018-02-15 DIAGNOSIS — M48062 Spinal stenosis, lumbar region with neurogenic claudication: Secondary | ICD-10-CM | POA: Diagnosis not present

## 2018-02-15 DIAGNOSIS — M545 Low back pain: Secondary | ICD-10-CM

## 2018-02-15 DIAGNOSIS — G8929 Other chronic pain: Secondary | ICD-10-CM

## 2018-02-15 MED ORDER — BETAMETHASONE SOD PHOS & ACET 6 (3-3) MG/ML IJ SUSP
12.0000 mg | Freq: Once | INTRAMUSCULAR | Status: AC
  Administered 2018-02-15: 12 mg

## 2018-02-15 NOTE — Progress Notes (Signed)
 .  Numeric Pain Rating Scale and Functional Assessment Average Pain 7   In the last MONTH (on 0-10 scale) has pain interfered with the following?  1. General activity like being  able to carry out your everyday physical activities such as walking, climbing stairs, carrying groceries, or moving a chair?  Rating(3)   +Driver, -BT, -Dye Allergies.  

## 2018-02-16 NOTE — Progress Notes (Signed)
Paul Herring - 78 y.o. male MRN [id number]  Date of birth: Aug 15, 1940  Office Visit Note: Visit Date: 02/15/2018 PCP: Marton Redwood, MD Referred by: Marton Redwood, MD  Subjective: Chief Complaint  Patient presents with  . Lower Back - Pain   HPI: Paul Herring is a 78 y.o. male who comes in today For evaluation management of low back pain some referral in the hips bilaterally.  I saw the patient approximately a year and a half ago and completed lumbar transforaminal injection at L4 with good relief of a lot of his back and leg pain.  He really reports his leg pain has not been nearly so severe since that time.  He does have difficulty walking for a longer distance but does walk most days for exercise.  He complains of a lot of back pain with standing.  MRI evidence of severe stenosis multifactorial L4-5 with facet arthropathy.  He has had no new trauma.  We have actually seen him off and on because we treat his wife.  He is really had no other red flag complaints no focal weakness.  We did bring him in today for planned L4 transforaminal injection as a diagnostic and hopefully therapeutic standpoint.  Would consider diagnostic medial branch blocks and radiofrequency ablation.  ROS Otherwise per HPI.  Assessment & Plan: Visit Diagnoses:  1. Spinal stenosis of lumbar region with neurogenic claudication   2. Chronic bilateral low back pain without sciatica     Plan: No additional findings.   Meds & Orders:  Meds ordered this encounter  Medications  . betamethasone acetate-betamethasone sodium phosphate (CELESTONE) injection 12 mg    Orders Placed This Encounter  Procedures  . XR C-ARM NO REPORT  . Epidural Steroid injection    Follow-up: Return if symptoms worsen or fail to improve.   Procedures: No procedures performed  Lumbosacral Transforaminal Epidural Steroid Injection - Sub-Pedicular Approach with Fluoroscopic Guidance  Patient: Paul Herring      Date of Birth:  23-Oct-1940 MRN: [id number] PCP: Marton Redwood, MD      Visit Date: 02/15/2018   Universal Protocol:    Date/Time: 02/15/2018  Consent Given By: the patient  Position: PRONE  Additional Comments: Vital signs were monitored before and after the procedure. Patient was prepped and draped in the usual sterile fashion. The correct patient, procedure, and site was verified.   Injection Procedure Details:  Procedure Site One Meds Administered:  Meds ordered this encounter  Medications  . betamethasone acetate-betamethasone sodium phosphate (CELESTONE) injection 12 mg    Laterality: Bilateral  Location/Site:  L4-L5  Needle size: 22 G  Needle type: Spinal  Needle Placement: Transforaminal  Findings:    -Comments: Excellent flow of contrast along the nerve and into the epidural space.  Procedure Details: After squaring off the end-plates to get a true AP view, the C-arm was positioned so that an oblique view of the foramen as noted above was visualized. The target area is just inferior to the "nose of the scotty dog" or sub pedicular. The soft tissues overlying this structure were infiltrated with 2-3 ml. of 1% Lidocaine without Epinephrine.  The spinal needle was inserted toward the target using a "trajectory" view along the fluoroscope beam.  Under AP and lateral visualization, the needle was advanced so it did not puncture dura and was located close the 6 O'Clock position of the pedical in AP tracterory. Biplanar projections were used to confirm position. Aspiration was confirmed to be negative for CSF  and/or blood. A 1-2 ml. volume of Isovue-250 was injected and flow of contrast was noted at each level. Radiographs were obtained for documentation purposes.   After attaining the desired flow of contrast documented above, a 0.5 to 1.0 ml test dose of 0.25% Marcaine was injected into each respective transforaminal space.  The patient was observed for 90 seconds post injection.   After no sensory deficits were reported, and normal lower extremity motor function was noted,   the above injectate was administered so that equal amounts of the injectate were placed at each foramen (level) into the transforaminal epidural space.   Additional Comments:  The patient tolerated the procedure well Dressing: Band-Aid    Post-procedure details: Patient was observed during the procedure. Post-procedure instructions were reviewed.  Patient left the clinic in stable condition.    Clinical History: Lumbar spine MRI 2015 MRI LUMBAR SPINE WITHOUT CONTRAST  TECHNIQUE: Multiplanar, multisequence MR imaging of the lumbar spine was performed. No intravenous contrast was administered.  COMPARISON:  None.  FINDINGS: Leftward curvature of the lumbar spine is centered at L3. Chronic endplate marrow changes are present from L1-2 through L5-S1, more pronounced on the right except at L5-S1 were changes are more prominent on the left.  Limited imaging of the abdomen is unremarkable.  Normal signal is present in the conus medullaris which terminates at L1.  L1-2: Mild disc bulging is asymmetric to the left. There is no significant stenosis.  L2-3: A broad-based disc herniation is present. Mild facet hypertrophy is noted bilaterally. This results in mild lateral recess narrowing bilaterally.  L3-4: A broad-based disc herniation is present. Moderate facet hypertrophy is noted. Mild lateral recess and foraminal narrowing is present bilaterally.  L4-5: This is the most severe level. A leftward disc protrusion is present. Moderate facet hypertrophy is present bilaterally. This results in severe central canal stenosis, left greater than right. Moderate left and mild right foraminal stenosis is present bilaterally.  L5-S1: A rightward disc protrusion is present. Asymmetric right-sided facet hypertrophy is noted. This results in mild right foraminal narrowing. The  central canal and left foramen are patent.  IMPRESSION: 1. Severe central canal stenosis at L4-5 secondary to a low left paramedian disc protrusion and moderate facet hypertrophy. 2. Moderate left and mild right foraminal stenosis at L4-5. 3. 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 4. Mild lateral recess and foraminal narrowing bilaterally at L2-3 and L3-4. 5. Mild right foraminal narrowing at L5-S1   Electronically Signed   By: Lawrence Santiago M.D.   On: 05/29/2013 21:09   He reports that he quit smoking about 42 years ago. He has never used smokeless tobacco. No results for input(s): HGBA1C, LABURIC in the last 8760 hours.  Objective:  VS:  HT:    WT:   BMI:     BP:(!) 150/94  HR:69bpm  TEMP:97.8 F (36.6 C)(Oral)  RESP:98 % Physical Exam  Ortho Exam Imaging: Xr C-arm No Report  Result Date: 02/15/2018 Please see Notes tab for imaging impression.   Past Medical/Family/Surgical/Social History: Medications & Allergies reviewed per EMR, new medications updated. There are no active problems to display for this patient.  Past Medical History:  Diagnosis Date  . Arthritis   . Cataract   . Chronic kidney disease   . Enlarged prostate   . GERD (gastroesophageal reflux disease)   . Hyperlipidemia   . Hypertension    Family History  Problem Relation Age of Onset  . Colon cancer Neg  Hx   . Esophageal cancer Neg Hx   . Rectal cancer Neg Hx   . Stomach cancer Neg Hx    Past Surgical History:  Procedure Laterality Date  . ANGIOGRAM/LV (CONGENITAL)    . Bladder Stent    . COLONOSCOPY    . POLYPECTOMY    . TONSILLECTOMY     Social History   Occupational History  . Not on file  Tobacco Use  . Smoking status: Former Smoker    Last attempt to quit: 1978    Years since quitting: 42.1  . Smokeless tobacco: Never Used  Substance and Sexual Activity  . Alcohol use: Yes    Comment: 2-3 glassses of wine   . Drug use: No  . Sexual activity:  Not on file

## 2018-02-16 NOTE — Procedures (Signed)
Lumbosacral Transforaminal Epidural Steroid Injection - Sub-Pedicular Approach with Fluoroscopic Guidance  Patient: Paul Herring      Date of Birth: [birth year] MRN: [id number] PCP: Marton Redwood, MD      Visit Date: 02/15/2018   Universal Protocol:    Date/Time: 02/15/2018  Consent Given By: the patient  Position: PRONE  Additional Comments: Vital signs were monitored before and after the procedure. Patient was prepped and draped in the usual sterile fashion. The correct patient, procedure, and site was verified.   Injection Procedure Details:  Procedure Site One Meds Administered:  Meds ordered this encounter  Medications  . betamethasone acetate-betamethasone sodium phosphate (CELESTONE) injection 12 mg    Laterality: Bilateral  Location/Site:  L4-L5  Needle size: 22 G  Needle type: Spinal  Needle Placement: Transforaminal  Findings:    -Comments: Excellent flow of contrast along the nerve and into the epidural space.  Procedure Details: After squaring off the end-plates to get a true AP view, the C-arm was positioned so that an oblique view of the foramen as noted above was visualized. The target area is just inferior to the "nose of the scotty dog" or sub pedicular. The soft tissues overlying this structure were infiltrated with 2-3 ml. of 1% Lidocaine without Epinephrine.  The spinal needle was inserted toward the target using a "trajectory" view along the fluoroscope beam.  Under AP and lateral visualization, the needle was advanced so it did not puncture dura and was located close the 6 O'Clock position of the pedical in AP tracterory. Biplanar projections were used to confirm position. Aspiration was confirmed to be negative for CSF and/or blood. A 1-2 ml. volume of Isovue-250 was injected and flow of contrast was noted at each level. Radiographs were obtained for documentation purposes.   After attaining the desired flow of contrast documented above, a 0.5 to  1.0 ml test dose of 0.25% Marcaine was injected into each respective transforaminal space.  The patient was observed for 90 seconds post injection.  After no sensory deficits were reported, and normal lower extremity motor function was noted,   the above injectate was administered so that equal amounts of the injectate were placed at each foramen (level) into the transforaminal epidural space.   Additional Comments:  The patient tolerated the procedure well Dressing: Band-Aid    Post-procedure details: Patient was observed during the procedure. Post-procedure instructions were reviewed.  Patient left the clinic in stable condition.

## 2018-03-15 DIAGNOSIS — L820 Inflamed seborrheic keratosis: Secondary | ICD-10-CM | POA: Diagnosis not present

## 2018-03-15 DIAGNOSIS — L821 Other seborrheic keratosis: Secondary | ICD-10-CM | POA: Diagnosis not present

## 2018-03-15 DIAGNOSIS — L570 Actinic keratosis: Secondary | ICD-10-CM | POA: Diagnosis not present

## 2018-05-29 ENCOUNTER — Telehealth: Payer: Self-pay | Admitting: Physical Medicine and Rehabilitation

## 2018-05-29 NOTE — Telephone Encounter (Signed)
The medication was the baclofen for his wife or the gabapentin for him?

## 2018-05-30 DIAGNOSIS — H5212 Myopia, left eye: Secondary | ICD-10-CM | POA: Diagnosis not present

## 2018-05-30 DIAGNOSIS — H353121 Nonexudative age-related macular degeneration, left eye, early dry stage: Secondary | ICD-10-CM | POA: Diagnosis not present

## 2018-05-30 DIAGNOSIS — H524 Presbyopia: Secondary | ICD-10-CM | POA: Diagnosis not present

## 2018-05-30 DIAGNOSIS — Z961 Presence of intraocular lens: Secondary | ICD-10-CM | POA: Diagnosis not present

## 2018-05-30 DIAGNOSIS — H52222 Regular astigmatism, left eye: Secondary | ICD-10-CM | POA: Diagnosis not present

## 2018-05-30 DIAGNOSIS — H353111 Nonexudative age-related macular degeneration, right eye, early dry stage: Secondary | ICD-10-CM | POA: Diagnosis not present

## 2018-05-30 NOTE — Telephone Encounter (Signed)
Patient states that he would like to try the gabapentin if you think it might benefit him. Pharmacy is correct.

## 2018-05-31 ENCOUNTER — Other Ambulatory Visit: Payer: Self-pay | Admitting: Physical Medicine and Rehabilitation

## 2018-05-31 DIAGNOSIS — M5416 Radiculopathy, lumbar region: Secondary | ICD-10-CM

## 2018-05-31 MED ORDER — GABAPENTIN 300 MG PO CAPS: ORAL_CAPSULE | ORAL | 0 refills | Status: DC

## 2018-05-31 NOTE — Telephone Encounter (Signed)
Called patient to advise  °

## 2018-05-31 NOTE — Telephone Encounter (Signed)
I did send in a prescription for gabapentin 300 mg to be taken at night for 7 nights and then in the morning and night for 7 nights and then 3 times a day.  This is on the bottle for instructions.  He can play with the dosing however depending on how he tolerates it.  The biggest side effect is sleepiness but this tends to go away the more regularly that it is taken.  This medication is for neuropathic pain and can do a good job if we get up to a decent enough dose of it.  It could help with the cramping type issues.

## 2018-05-31 NOTE — Progress Notes (Signed)
Taper up of gabapentin for neuropathic pain

## 2018-06-26 ENCOUNTER — Telehealth: Payer: Self-pay | Admitting: Physical Medicine and Rehabilitation

## 2018-06-26 ENCOUNTER — Other Ambulatory Visit: Payer: Self-pay | Admitting: Physical Medicine and Rehabilitation

## 2018-06-26 MED ORDER — GABAPENTIN 300 MG PO CAPS: 300.0000 mg | ORAL_CAPSULE | Freq: Three times a day (TID) | ORAL | 4 refills | Status: DC

## 2018-06-26 NOTE — Telephone Encounter (Signed)
done

## 2018-07-12 ENCOUNTER — Other Ambulatory Visit: Payer: Self-pay | Admitting: Physical Medicine and Rehabilitation

## 2018-07-12 ENCOUNTER — Telehealth: Payer: Self-pay | Admitting: Physical Medicine and Rehabilitation

## 2018-07-12 DIAGNOSIS — M792 Neuralgia and neuritis, unspecified: Secondary | ICD-10-CM

## 2018-07-12 MED ORDER — GABAPENTIN 100 MG PO CAPS: 100.0000 mg | ORAL_CAPSULE | Freq: Three times a day (TID) | ORAL | 0 refills | Status: DC | PRN

## 2018-07-12 NOTE — Telephone Encounter (Signed)
done

## 2018-09-07 DIAGNOSIS — Z23 Encounter for immunization: Secondary | ICD-10-CM | POA: Diagnosis not present

## 2018-09-12 ENCOUNTER — Other Ambulatory Visit: Payer: Self-pay | Admitting: Physical Medicine and Rehabilitation

## 2018-09-12 DIAGNOSIS — M792 Neuralgia and neuritis, unspecified: Secondary | ICD-10-CM

## 2018-09-12 MED ORDER — GABAPENTIN 100 MG PO CAPS: 100.0000 mg | ORAL_CAPSULE | Freq: Every day | ORAL | 4 refills | Status: DC

## 2018-10-09 DIAGNOSIS — L820 Inflamed seborrheic keratosis: Secondary | ICD-10-CM | POA: Diagnosis not present

## 2019-01-25 DIAGNOSIS — E7849 Other hyperlipidemia: Secondary | ICD-10-CM | POA: Diagnosis not present

## 2019-01-25 DIAGNOSIS — M109 Gout, unspecified: Secondary | ICD-10-CM | POA: Diagnosis not present

## 2019-01-25 DIAGNOSIS — R82998 Other abnormal findings in urine: Secondary | ICD-10-CM | POA: Diagnosis not present

## 2019-01-25 DIAGNOSIS — R7301 Impaired fasting glucose: Secondary | ICD-10-CM | POA: Diagnosis not present

## 2019-01-25 DIAGNOSIS — I10 Essential (primary) hypertension: Secondary | ICD-10-CM | POA: Diagnosis not present

## 2019-02-01 DIAGNOSIS — E785 Hyperlipidemia, unspecified: Secondary | ICD-10-CM | POA: Diagnosis not present

## 2019-02-01 DIAGNOSIS — Z86010 Personal history of colonic polyps: Secondary | ICD-10-CM | POA: Diagnosis not present

## 2019-02-01 DIAGNOSIS — R7301 Impaired fasting glucose: Secondary | ICD-10-CM | POA: Diagnosis not present

## 2019-02-01 DIAGNOSIS — M5416 Radiculopathy, lumbar region: Secondary | ICD-10-CM | POA: Diagnosis not present

## 2019-02-01 DIAGNOSIS — N1831 Chronic kidney disease, stage 3a: Secondary | ICD-10-CM | POA: Diagnosis not present

## 2019-02-01 DIAGNOSIS — M109 Gout, unspecified: Secondary | ICD-10-CM | POA: Diagnosis not present

## 2019-02-01 DIAGNOSIS — N401 Enlarged prostate with lower urinary tract symptoms: Secondary | ICD-10-CM | POA: Diagnosis not present

## 2019-02-01 DIAGNOSIS — Z1331 Encounter for screening for depression: Secondary | ICD-10-CM | POA: Diagnosis not present

## 2019-02-01 DIAGNOSIS — I129 Hypertensive chronic kidney disease with stage 1 through stage 4 chronic kidney disease, or unspecified chronic kidney disease: Secondary | ICD-10-CM | POA: Diagnosis not present

## 2019-02-01 DIAGNOSIS — Z0000 Encounter for general adult medical examination without abnormal findings: Secondary | ICD-10-CM | POA: Diagnosis not present

## 2019-02-01 DIAGNOSIS — K219 Gastro-esophageal reflux disease without esophagitis: Secondary | ICD-10-CM | POA: Diagnosis not present

## 2019-02-01 DIAGNOSIS — Z1339 Encounter for screening examination for other mental health and behavioral disorders: Secondary | ICD-10-CM | POA: Diagnosis not present

## 2019-02-03 ENCOUNTER — Other Ambulatory Visit: Payer: Self-pay | Admitting: Physical Medicine and Rehabilitation

## 2019-02-03 DIAGNOSIS — M792 Neuralgia and neuritis, unspecified: Secondary | ICD-10-CM

## 2019-02-04 NOTE — Telephone Encounter (Signed)
Please advise 

## 2019-02-04 NOTE — Telephone Encounter (Signed)
Patient states that the timing varies right now. He states that he takes 100 mg every night and might take another during the day if he is active. He states that when he starts doing more walking again he s=will take more consistently and will see if 300 mg works better. He states that the 300 mg made him have problems with balance, so her wold like to stick with 100 mg for now.

## 2019-02-04 NOTE — Telephone Encounter (Signed)
Need to see how he is taking at this point ie, dose and timing

## 2019-02-26 ENCOUNTER — Telehealth: Payer: Self-pay | Admitting: Physical Medicine and Rehabilitation

## 2019-02-27 NOTE — Telephone Encounter (Signed)
Yes ok, should allow 30 min

## 2019-02-27 NOTE — Telephone Encounter (Signed)
Scheduled for 3/15 at 1400 with driver.

## 2019-03-18 ENCOUNTER — Other Ambulatory Visit: Payer: Self-pay

## 2019-03-18 ENCOUNTER — Ambulatory Visit: Payer: Self-pay

## 2019-03-18 ENCOUNTER — Ambulatory Visit (INDEPENDENT_AMBULATORY_CARE_PROVIDER_SITE_OTHER): Payer: Medicare Other | Admitting: Physical Medicine and Rehabilitation

## 2019-03-18 ENCOUNTER — Encounter: Payer: Self-pay | Admitting: Physical Medicine and Rehabilitation

## 2019-03-18 VITALS — BP 152/90 | HR 71

## 2019-03-18 DIAGNOSIS — M47816 Spondylosis without myelopathy or radiculopathy, lumbar region: Secondary | ICD-10-CM

## 2019-03-18 DIAGNOSIS — M48062 Spinal stenosis, lumbar region with neurogenic claudication: Secondary | ICD-10-CM

## 2019-03-18 DIAGNOSIS — M5416 Radiculopathy, lumbar region: Secondary | ICD-10-CM | POA: Diagnosis not present

## 2019-03-18 DIAGNOSIS — M1A0720 Idiopathic chronic gout, left ankle and foot, without tophus (tophi): Secondary | ICD-10-CM | POA: Diagnosis not present

## 2019-03-18 DIAGNOSIS — M25572 Pain in left ankle and joints of left foot: Secondary | ICD-10-CM

## 2019-03-18 DIAGNOSIS — M5116 Intervertebral disc disorders with radiculopathy, lumbar region: Secondary | ICD-10-CM

## 2019-03-18 MED ORDER — METHYLPREDNISOLONE ACETATE 80 MG/ML IJ SUSP
40.0000 mg | Freq: Once | INTRAMUSCULAR | Status: AC
  Administered 2019-03-18: 40 mg

## 2019-03-18 NOTE — Progress Notes (Signed)
 .  Numeric Pain Rating Scale and Functional Assessment Average Pain 5   In the last MONTH (on 0-10 scale) has pain interfered with the following?  1. General activity like being  able to carry out your everyday physical activities such as walking, climbing stairs, carrying groceries, or moving a chair?  Rating(6)   +Driver, -BT, -Dye Allergies.  

## 2019-03-21 ENCOUNTER — Encounter: Payer: Self-pay | Admitting: Physical Medicine and Rehabilitation

## 2019-03-21 DIAGNOSIS — M47816 Spondylosis without myelopathy or radiculopathy, lumbar region: Secondary | ICD-10-CM | POA: Insufficient documentation

## 2019-03-21 DIAGNOSIS — M5416 Radiculopathy, lumbar region: Secondary | ICD-10-CM | POA: Insufficient documentation

## 2019-03-21 DIAGNOSIS — M48062 Spinal stenosis, lumbar region with neurogenic claudication: Secondary | ICD-10-CM | POA: Insufficient documentation

## 2019-03-21 DIAGNOSIS — M25572 Pain in left ankle and joints of left foot: Secondary | ICD-10-CM | POA: Insufficient documentation

## 2019-03-21 NOTE — Progress Notes (Signed)
[name] - 79 y.o. male MRN [id number]  Date of birth: 10/24/1940  Office Visit Note: Visit Date: 03/18/2019 PCP: Paul Redwood, MD Referred by: Paul Redwood, MD  Subjective: Chief Complaint  Patient presents with  . Lower Back - Pain  . Right Leg - Pain  . Left Leg - Pain  . Left Ankle - Pain   HPI: Paul Herring is a 79 y.o. male who comes in today For evaluation and management of chronic history of back pain now with exacerbation of bilateral hip and leg pain.  He is also reporting left ankle pain.  In terms of his low back and bilateral hip and leg pain the last time we saw him in the office was in February.  We completed bilateral L4 transforaminal injection with some relief of his symptoms.  The injection does seem to help to a degree.  His symptoms are interesting that he has mostly low back pain but he does get some type of radicular type pain down into the legs with walking.  He gets a lot of pain with bending.  Does get some level of paresthesia.  We have not completed facet joint diagnostic blocks.  MRI from 2015 shows facet arthropathy at L4-5 with disc herniation and severe canal narrowing.  This is not really been updated because of his symptoms have not really changed and he has had no focal weakness.  He does walk for exercise and he used to get to the gym before the coronavirus and has not been able to do that and he thinks that is affected him to some degree.  He has not noted any foot drop or focal weakness.  He reports using the gabapentin at different times because it was making his balance off.  He will take it at night because it does help him rest and then he was taking it in the morning.  He ended up reducing his dose down to just 100 mg.  He now reports exacerbation of worsening hip and leg pain.  He also reports history of chronic left ankle pain now with worsening left ankle pain but this is resolved to a degree with colchicine.  He does have a  history of gout.  He had questions today about colchicine and his left ankle.  Review of Systems  Musculoskeletal: Positive for back pain and joint pain.  All other systems reviewed and are negative.  Otherwise per HPI.  Assessment & Plan: Visit Diagnoses:  1. Lumbar radiculopathy   2. Spinal stenosis of lumbar region with neurogenic claudication   3. Radiculopathy due to lumbar intervertebral disc disorder   4. Spondylosis without myelopathy or radiculopathy, lumbar region   5. Pain in left ankle and joints of left foot   6. Idiopathic chronic gout of left ankle without tophus     Plan: Findings:  1.  Chronic back pain with exacerbation of severe back and hip and leg pain bilaterally.  This is in the setting of random use of gabapentin and he does use Tylenol.  We talked about using Tylenol scheduled and we have talked about the normal use of gabapentin and the dosing.  He does have some chronic kidney disease and he may be able to get away with twice a day dosing of gabapentin.  He wants to slowly try to get back with the gabapentin at night and in the morning but he wants to do this at a reduced dose.  We talked at great  length about the safety of the medication and the fact that he can titrate this up.  We talked about normal dosages and the effect.  I also think he would do well potentially with trying an interlaminar epidural injection at L5-S1 below the level of stenosis and to see if he gets more relief for that.  I'm going to be sure to entertain the idea of repeating his MRI at this point if he is just not getting better.Injection was performed today to the severity of his symptoms.  2.  Chronic history of left ankle pain history of gout.  He has been taking colchicine and he basically is self titrating this to the level where he does get some diarrhea or loose stools and that seems to be at the point where it helps the pain.  I discussed with him the mode of action of colchicine and how  that is typically what happens.  If that is relieving his ankle pain and it may be gout.  He reports he doesn't have any signs of gout he just has ankle pain.  I did offer a consultation with Dr. Sharol Given in the office who is her foot and ankle specialist and the patient declined at this point.  He just wanted to really know about the medication in general.    Meds & Orders:  Meds ordered this encounter  Medications  . methylPREDNISolone acetate (DEPO-MEDROL) injection 40 mg    Orders Placed This Encounter  Procedures  . XR C-ARM NO REPORT  . Epidural Steroid injection    Follow-up: Return if symptoms worsen or fail to improve.   Procedures: No procedures performed  Lumbar Epidural Steroid Injection - Interlaminar Approach with Fluoroscopic Guidance  Patient: Paul Herring      Date of Birth: 1940/09/11 MRN: [id number] PCP: Paul Redwood, MD      Visit Date: 03/18/2019   Universal Protocol:     Consent Given By: the patient  Position: PRONE  Additional Comments: Vital signs were monitored before and after the procedure. Patient was prepped and draped in the usual sterile fashion. The correct patient, procedure, and site was verified.   Injection Procedure Details:  Procedure Site One Meds Administered:  Meds ordered this encounter  Medications  . methylPREDNISolone acetate (DEPO-MEDROL) injection 40 mg     Laterality: Right  Location/Site:  L5-S1  Needle size: 20 G  Needle type: Tuohy  Needle Placement: Paramedian epidural  Findings:   -Comments: Excellent flow of contrast into the epidural space.  Procedure Details: Using a paramedian approach from the side mentioned above, the region overlying the inferior lamina was localized under fluoroscopic visualization and the soft tissues overlying this structure were infiltrated with 4 ml. of 1% Lidocaine without Epinephrine. The Tuohy needle was inserted into the epidural space using a paramedian approach.    The epidural space was localized using loss of resistance along with lateral and bi-planar fluoroscopic views.  After negative aspirate for air, blood, and CSF, a 2 ml. volume of Isovue-250 was injected into the epidural space and the flow of contrast was observed. Radiographs were obtained for documentation purposes.    The injectate was administered into the level noted above.   Additional Comments:  The patient tolerated the procedure well Dressing: 2 x 2 sterile gauze and Band-Aid    Post-procedure details: Patient was observed during the procedure. Post-procedure instructions were reviewed.  Patient left the clinic in stable condition.    Clinical History: Lumbar spine MRI  2015 MRI LUMBAR SPINE WITHOUT CONTRAST  TECHNIQUE: Multiplanar, multisequence MR imaging of the lumbar spine was performed. No intravenous contrast was administered.  COMPARISON:  None.  FINDINGS: Leftward curvature of the lumbar spine is centered at L3. Chronic endplate marrow changes are present from L1-2 through L5-S1, more pronounced on the right except at L5-S1 were changes are more prominent on the left.  Limited imaging of the abdomen is unremarkable.  Normal signal is present in the conus medullaris which terminates at L1.  L1-2: Mild disc bulging is asymmetric to the left. There is no significant stenosis.  L2-3: A broad-based disc herniation is present. Mild facet hypertrophy is noted bilaterally. This results in mild lateral recess narrowing bilaterally.  L3-4: A broad-based disc herniation is present. Moderate facet hypertrophy is noted. Mild lateral recess and foraminal narrowing is present bilaterally.  L4-5: This is the most severe level. A leftward disc protrusion is present. Moderate facet hypertrophy is present bilaterally. This results in severe central canal stenosis, left greater than right. Moderate left and mild right foraminal stenosis is  present bilaterally.  L5-S1: A rightward disc protrusion is present. Asymmetric right-sided facet hypertrophy is noted. This results in mild right foraminal narrowing. The central canal and left foramen are patent.  IMPRESSION: 1. Severe central canal stenosis at L4-5 secondary to a low left paramedian disc protrusion and moderate facet hypertrophy. 2. Moderate left and mild right foraminal stenosis at L4-5. 3. 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 4. Mild lateral recess and foraminal narrowing bilaterally at L2-3 and L3-4. 5. Mild right foraminal narrowing at L5-S1   Electronically Signed   By: Lawrence Santiago M.D.   On: 05/29/2013 21:09   He reports that he quit smoking about 43 years ago. He has never used smokeless tobacco. No results for input(s): HGBA1C, LABURIC in the last 8760 hours.  Objective:  VS:  HT:    WT:   BMI:     BP:(!) 152/90  HR:71bpm  TEMP: ( )  RESP:  Physical Exam Constitutional:      General: He is not in acute distress.    Appearance: Normal appearance. He is not ill-appearing.  HENT:     Head: Normocephalic and atraumatic.     Right Ear: External ear normal.     Left Ear: External ear normal.  Eyes:     Extraocular Movements: Extraocular movements intact.  Cardiovascular:     Rate and Rhythm: Normal rate.     Pulses: Normal pulses.  Abdominal:     General: There is no distension.     Palpations: Abdomen is soft.  Musculoskeletal:        General: No tenderness or signs of injury.     Right lower leg: No edema.     Left lower leg: Edema present.     Comments: Patient has good distal strength without clonus.  Left ankle is mildly swollen compared to right.  He has good range of motion.  No allodynia.  He has a negative slump test.  He does ambulate with a forward flexed lumbar spine he does have pain with extension of the lumbar spine facet loading.  Skin:    Findings: No erythema or rash.  Neurological:      General: No focal deficit present.     Mental Status: He is alert and oriented to person, place, and time.     Sensory: No sensory deficit.     Motor: No weakness or abnormal  muscle tone.     Coordination: Coordination normal.     Gait: Gait abnormal.     Comments: Patient seems to have some mild balance difficulty in general.  Psychiatric:        Mood and Affect: Mood normal.        Behavior: Behavior normal.     Ortho Exam Imaging: No results found.  Past Medical/Family/Surgical/Social History: Medications & Allergies reviewed per EMR, new medications updated. Patient Active Problem List   Diagnosis Date Noted  . Lumbar radiculopathy 03/21/2019  . Spinal stenosis of lumbar region with neurogenic claudication 03/21/2019  . Spondylosis without myelopathy or radiculopathy, lumbar region 03/21/2019  . Pain in left ankle and joints of left foot 03/21/2019   Past Medical History:  Diagnosis Date  . Arthritis   . Cataract   . Chronic kidney disease   . Enlarged prostate   . GERD (gastroesophageal reflux disease)   . Hyperlipidemia   . Hypertension    Family History  Problem Relation Age of Onset  . Colon cancer Neg Hx   . Esophageal cancer Neg Hx   . Rectal cancer Neg Hx   . Stomach cancer Neg Hx    Past Surgical History:  Procedure Laterality Date  . ANGIOGRAM/LV (CONGENITAL)    . Bladder Stent    . COLONOSCOPY    . POLYPECTOMY    . TONSILLECTOMY     Social History   Occupational History  . Not on file  Tobacco Use  . Smoking status: Former Smoker    Quit date: 1978    Years since quitting: 43.2  . Smokeless tobacco: Never Used  Substance and Sexual Activity  . Alcohol use: Yes    Comment: 2-3 glassses of wine   . Drug use: No  . Sexual activity: Not on file

## 2019-03-21 NOTE — Procedures (Signed)
Lumbar Epidural Steroid Injection - Interlaminar Approach with Fluoroscopic Guidance  Patient: Paul Herring      Date of Birth: December 02, 1940 MRN: [id number] PCP: Marton Redwood, MD      Visit Date: 03/18/2019   Universal Protocol:     Consent Given By: the patient  Position: PRONE  Additional Comments: Vital signs were monitored before and after the procedure. Patient was prepped and draped in the usual sterile fashion. The correct patient, procedure, and site was verified.   Injection Procedure Details:  Procedure Site One Meds Administered:  Meds ordered this encounter  Medications  . methylPREDNISolone acetate (DEPO-MEDROL) injection 40 mg     Laterality: Right  Location/Site:  L5-S1  Needle size: 20 G  Needle type: Tuohy  Needle Placement: Paramedian epidural  Findings:   -Comments: Excellent flow of contrast into the epidural space.  Procedure Details: Using a paramedian approach from the side mentioned above, the region overlying the inferior lamina was localized under fluoroscopic visualization and the soft tissues overlying this structure were infiltrated with 4 ml. of 1% Lidocaine without Epinephrine. The Tuohy needle was inserted into the epidural space using a paramedian approach.   The epidural space was localized using loss of resistance along with lateral and bi-planar fluoroscopic views.  After negative aspirate for air, blood, and CSF, a 2 ml. volume of Isovue-250 was injected into the epidural space and the flow of contrast was observed. Radiographs were obtained for documentation purposes.    The injectate was administered into the level noted above.   Additional Comments:  The patient tolerated the procedure well Dressing: 2 x 2 sterile gauze and Band-Aid    Post-procedure details: Patient was observed during the procedure. Post-procedure instructions were reviewed.  Patient left the clinic in stable condition.

## 2019-04-02 ENCOUNTER — Telehealth: Payer: Self-pay | Admitting: Physical Medicine and Rehabilitation

## 2019-04-02 NOTE — Telephone Encounter (Signed)
Patient is requesting a prescription for a new back brace.

## 2019-04-02 NOTE — Telephone Encounter (Signed)
Does he just want a script or does he want to get order from [facility] to fit him?

## 2019-05-30 DIAGNOSIS — H35039 Hypertensive retinopathy, unspecified eye: Secondary | ICD-10-CM | POA: Diagnosis not present

## 2019-05-30 DIAGNOSIS — I10 Essential (primary) hypertension: Secondary | ICD-10-CM | POA: Diagnosis not present

## 2019-05-30 DIAGNOSIS — Z961 Presence of intraocular lens: Secondary | ICD-10-CM | POA: Diagnosis not present

## 2019-05-30 DIAGNOSIS — H35033 Hypertensive retinopathy, bilateral: Secondary | ICD-10-CM | POA: Diagnosis not present

## 2019-05-30 DIAGNOSIS — H353191 Nonexudative age-related macular degeneration, unspecified eye, early dry stage: Secondary | ICD-10-CM | POA: Diagnosis not present

## 2019-05-30 DIAGNOSIS — H353131 Nonexudative age-related macular degeneration, bilateral, early dry stage: Secondary | ICD-10-CM | POA: Diagnosis not present

## 2019-06-21 ENCOUNTER — Other Ambulatory Visit: Payer: Self-pay | Admitting: Physical Medicine and Rehabilitation

## 2019-06-21 DIAGNOSIS — M792 Neuralgia and neuritis, unspecified: Secondary | ICD-10-CM

## 2019-06-21 NOTE — Telephone Encounter (Signed)
Please advise 

## 2019-09-10 DIAGNOSIS — H1033 Unspecified acute conjunctivitis, bilateral: Secondary | ICD-10-CM | POA: Diagnosis not present

## 2019-09-20 DIAGNOSIS — H5212 Myopia, left eye: Secondary | ICD-10-CM | POA: Diagnosis not present

## 2019-09-20 DIAGNOSIS — H16142 Punctate keratitis, left eye: Secondary | ICD-10-CM | POA: Diagnosis not present

## 2019-09-20 DIAGNOSIS — H52222 Regular astigmatism, left eye: Secondary | ICD-10-CM | POA: Diagnosis not present

## 2019-09-20 DIAGNOSIS — H524 Presbyopia: Secondary | ICD-10-CM | POA: Diagnosis not present

## 2019-09-27 DIAGNOSIS — Z23 Encounter for immunization: Secondary | ICD-10-CM | POA: Diagnosis not present

## 2019-10-15 DIAGNOSIS — Z23 Encounter for immunization: Secondary | ICD-10-CM | POA: Diagnosis not present

## 2020-01-27 DIAGNOSIS — M109 Gout, unspecified: Secondary | ICD-10-CM | POA: Diagnosis not present

## 2020-01-27 DIAGNOSIS — R7301 Impaired fasting glucose: Secondary | ICD-10-CM | POA: Diagnosis not present

## 2020-01-27 DIAGNOSIS — E785 Hyperlipidemia, unspecified: Secondary | ICD-10-CM | POA: Diagnosis not present

## 2020-02-11 DIAGNOSIS — I10 Essential (primary) hypertension: Secondary | ICD-10-CM | POA: Diagnosis not present

## 2020-02-11 DIAGNOSIS — K219 Gastro-esophageal reflux disease without esophagitis: Secondary | ICD-10-CM | POA: Diagnosis not present

## 2020-02-11 DIAGNOSIS — Z1331 Encounter for screening for depression: Secondary | ICD-10-CM | POA: Diagnosis not present

## 2020-02-11 DIAGNOSIS — R7301 Impaired fasting glucose: Secondary | ICD-10-CM | POA: Diagnosis not present

## 2020-02-11 DIAGNOSIS — R17 Unspecified jaundice: Secondary | ICD-10-CM | POA: Diagnosis not present

## 2020-02-11 DIAGNOSIS — J069 Acute upper respiratory infection, unspecified: Secondary | ICD-10-CM | POA: Diagnosis not present

## 2020-02-11 DIAGNOSIS — N401 Enlarged prostate with lower urinary tract symptoms: Secondary | ICD-10-CM | POA: Diagnosis not present

## 2020-02-11 DIAGNOSIS — N1831 Chronic kidney disease, stage 3a: Secondary | ICD-10-CM | POA: Diagnosis not present

## 2020-02-11 DIAGNOSIS — Z0000 Encounter for general adult medical examination without abnormal findings: Secondary | ICD-10-CM | POA: Diagnosis not present

## 2020-02-11 DIAGNOSIS — E785 Hyperlipidemia, unspecified: Secondary | ICD-10-CM | POA: Diagnosis not present

## 2020-02-11 DIAGNOSIS — M5416 Radiculopathy, lumbar region: Secondary | ICD-10-CM | POA: Diagnosis not present

## 2020-02-11 DIAGNOSIS — I129 Hypertensive chronic kidney disease with stage 1 through stage 4 chronic kidney disease, or unspecified chronic kidney disease: Secondary | ICD-10-CM | POA: Diagnosis not present

## 2020-02-14 DIAGNOSIS — E785 Hyperlipidemia, unspecified: Secondary | ICD-10-CM | POA: Diagnosis not present

## 2020-02-14 DIAGNOSIS — R82998 Other abnormal findings in urine: Secondary | ICD-10-CM | POA: Diagnosis not present

## 2020-04-02 DIAGNOSIS — Z23 Encounter for immunization: Secondary | ICD-10-CM | POA: Diagnosis not present

## 2020-05-13 DIAGNOSIS — Z86010 Personal history of colonic polyps: Secondary | ICD-10-CM | POA: Diagnosis not present

## 2020-05-13 DIAGNOSIS — N1831 Chronic kidney disease, stage 3a: Secondary | ICD-10-CM | POA: Diagnosis not present

## 2020-05-13 DIAGNOSIS — E785 Hyperlipidemia, unspecified: Secondary | ICD-10-CM | POA: Diagnosis not present

## 2020-05-13 DIAGNOSIS — R17 Unspecified jaundice: Secondary | ICD-10-CM | POA: Diagnosis not present

## 2020-05-13 DIAGNOSIS — N401 Enlarged prostate with lower urinary tract symptoms: Secondary | ICD-10-CM | POA: Diagnosis not present

## 2020-05-13 DIAGNOSIS — R7301 Impaired fasting glucose: Secondary | ICD-10-CM | POA: Diagnosis not present

## 2020-05-13 DIAGNOSIS — I10 Essential (primary) hypertension: Secondary | ICD-10-CM | POA: Diagnosis not present

## 2020-05-13 DIAGNOSIS — M5416 Radiculopathy, lumbar region: Secondary | ICD-10-CM | POA: Diagnosis not present

## 2020-05-13 DIAGNOSIS — M549 Dorsalgia, unspecified: Secondary | ICD-10-CM | POA: Diagnosis not present

## 2020-05-13 DIAGNOSIS — I129 Hypertensive chronic kidney disease with stage 1 through stage 4 chronic kidney disease, or unspecified chronic kidney disease: Secondary | ICD-10-CM | POA: Diagnosis not present

## 2020-05-13 DIAGNOSIS — M109 Gout, unspecified: Secondary | ICD-10-CM | POA: Diagnosis not present

## 2020-05-13 DIAGNOSIS — K219 Gastro-esophageal reflux disease without esophagitis: Secondary | ICD-10-CM | POA: Diagnosis not present

## 2020-06-18 DIAGNOSIS — Z961 Presence of intraocular lens: Secondary | ICD-10-CM | POA: Diagnosis not present

## 2020-06-18 DIAGNOSIS — E7800 Pure hypercholesterolemia, unspecified: Secondary | ICD-10-CM | POA: Diagnosis not present

## 2020-06-18 DIAGNOSIS — H04123 Dry eye syndrome of bilateral lacrimal glands: Secondary | ICD-10-CM | POA: Diagnosis not present

## 2020-06-18 DIAGNOSIS — H5212 Myopia, left eye: Secondary | ICD-10-CM | POA: Diagnosis not present

## 2020-06-18 DIAGNOSIS — T7840XS Allergy, unspecified, sequela: Secondary | ICD-10-CM | POA: Diagnosis not present

## 2020-06-18 DIAGNOSIS — I10 Essential (primary) hypertension: Secondary | ICD-10-CM | POA: Diagnosis not present

## 2020-06-18 DIAGNOSIS — H3530 Unspecified macular degeneration: Secondary | ICD-10-CM | POA: Diagnosis not present

## 2020-06-18 DIAGNOSIS — H52222 Regular astigmatism, left eye: Secondary | ICD-10-CM | POA: Diagnosis not present

## 2020-06-18 DIAGNOSIS — H35033 Hypertensive retinopathy, bilateral: Secondary | ICD-10-CM | POA: Diagnosis not present

## 2020-06-18 DIAGNOSIS — H40043 Steroid responder, bilateral: Secondary | ICD-10-CM | POA: Diagnosis not present

## 2020-06-18 DIAGNOSIS — H524 Presbyopia: Secondary | ICD-10-CM | POA: Diagnosis not present

## 2020-06-18 DIAGNOSIS — J019 Acute sinusitis, unspecified: Secondary | ICD-10-CM | POA: Diagnosis not present

## 2020-09-04 ENCOUNTER — Other Ambulatory Visit: Payer: Self-pay | Admitting: Physical Medicine and Rehabilitation

## 2020-09-04 DIAGNOSIS — M792 Neuralgia and neuritis, unspecified: Secondary | ICD-10-CM

## 2020-09-08 NOTE — Telephone Encounter (Signed)
Please advise 

## 2020-09-08 NOTE — Telephone Encounter (Signed)
Patient states that he no longer takes the '300mg'$ . He takes 1 '100mg'$  once per day. If his leg is really bothering him he might take 1 or 2 more of the '100mg'$ , but the '300mg'$  affected his balance too much.

## 2020-09-11 DIAGNOSIS — Z23 Encounter for immunization: Secondary | ICD-10-CM | POA: Diagnosis not present

## 2020-10-21 DIAGNOSIS — H52222 Regular astigmatism, left eye: Secondary | ICD-10-CM | POA: Diagnosis not present

## 2020-10-21 DIAGNOSIS — H5212 Myopia, left eye: Secondary | ICD-10-CM | POA: Diagnosis not present

## 2020-10-21 DIAGNOSIS — H3589 Other specified retinal disorders: Secondary | ICD-10-CM | POA: Diagnosis not present

## 2020-10-21 DIAGNOSIS — H524 Presbyopia: Secondary | ICD-10-CM | POA: Diagnosis not present

## 2020-10-21 DIAGNOSIS — H353132 Nonexudative age-related macular degeneration, bilateral, intermediate dry stage: Secondary | ICD-10-CM | POA: Diagnosis not present

## 2020-10-21 DIAGNOSIS — H3530 Unspecified macular degeneration: Secondary | ICD-10-CM | POA: Diagnosis not present

## 2021-01-06 DIAGNOSIS — N1831 Chronic kidney disease, stage 3a: Secondary | ICD-10-CM | POA: Diagnosis not present

## 2021-01-06 DIAGNOSIS — R051 Acute cough: Secondary | ICD-10-CM | POA: Diagnosis not present

## 2021-01-06 DIAGNOSIS — Z1152 Encounter for screening for COVID-19: Secondary | ICD-10-CM | POA: Diagnosis not present

## 2021-01-06 DIAGNOSIS — J189 Pneumonia, unspecified organism: Secondary | ICD-10-CM | POA: Diagnosis not present

## 2021-01-28 ENCOUNTER — Other Ambulatory Visit: Payer: Self-pay | Admitting: Physical Medicine and Rehabilitation

## 2021-01-28 DIAGNOSIS — M792 Neuralgia and neuritis, unspecified: Secondary | ICD-10-CM

## 2021-02-02 DIAGNOSIS — R2681 Unsteadiness on feet: Secondary | ICD-10-CM | POA: Diagnosis not present

## 2021-02-02 DIAGNOSIS — Q178 Other specified congenital malformations of ear: Secondary | ICD-10-CM | POA: Diagnosis not present

## 2021-02-02 DIAGNOSIS — I129 Hypertensive chronic kidney disease with stage 1 through stage 4 chronic kidney disease, or unspecified chronic kidney disease: Secondary | ICD-10-CM | POA: Diagnosis not present

## 2021-02-02 DIAGNOSIS — J189 Pneumonia, unspecified organism: Secondary | ICD-10-CM | POA: Diagnosis not present

## 2021-03-18 DIAGNOSIS — Z7952 Long term (current) use of systemic steroids: Secondary | ICD-10-CM | POA: Diagnosis not present

## 2021-03-18 DIAGNOSIS — Z125 Encounter for screening for malignant neoplasm of prostate: Secondary | ICD-10-CM | POA: Diagnosis not present

## 2021-03-18 DIAGNOSIS — I10 Essential (primary) hypertension: Secondary | ICD-10-CM | POA: Diagnosis not present

## 2021-03-18 DIAGNOSIS — R7301 Impaired fasting glucose: Secondary | ICD-10-CM | POA: Diagnosis not present

## 2021-03-18 DIAGNOSIS — E785 Hyperlipidemia, unspecified: Secondary | ICD-10-CM | POA: Diagnosis not present

## 2021-03-22 DIAGNOSIS — R82998 Other abnormal findings in urine: Secondary | ICD-10-CM | POA: Diagnosis not present

## 2021-03-22 DIAGNOSIS — Z86010 Personal history of colonic polyps: Secondary | ICD-10-CM | POA: Diagnosis not present

## 2021-03-22 DIAGNOSIS — R17 Unspecified jaundice: Secondary | ICD-10-CM | POA: Diagnosis not present

## 2021-03-22 DIAGNOSIS — Z1331 Encounter for screening for depression: Secondary | ICD-10-CM | POA: Diagnosis not present

## 2021-03-22 DIAGNOSIS — I10 Essential (primary) hypertension: Secondary | ICD-10-CM | POA: Diagnosis not present

## 2021-03-22 DIAGNOSIS — N401 Enlarged prostate with lower urinary tract symptoms: Secondary | ICD-10-CM | POA: Diagnosis not present

## 2021-03-22 DIAGNOSIS — I129 Hypertensive chronic kidney disease with stage 1 through stage 4 chronic kidney disease, or unspecified chronic kidney disease: Secondary | ICD-10-CM | POA: Diagnosis not present

## 2021-03-22 DIAGNOSIS — N1831 Chronic kidney disease, stage 3a: Secondary | ICD-10-CM | POA: Diagnosis not present

## 2021-03-22 DIAGNOSIS — R7301 Impaired fasting glucose: Secondary | ICD-10-CM | POA: Diagnosis not present

## 2021-03-22 DIAGNOSIS — Z1339 Encounter for screening examination for other mental health and behavioral disorders: Secondary | ICD-10-CM | POA: Diagnosis not present

## 2021-03-22 DIAGNOSIS — Z0000 Encounter for general adult medical examination without abnormal findings: Secondary | ICD-10-CM | POA: Diagnosis not present

## 2021-03-22 DIAGNOSIS — E785 Hyperlipidemia, unspecified: Secondary | ICD-10-CM | POA: Diagnosis not present

## 2021-03-22 DIAGNOSIS — M549 Dorsalgia, unspecified: Secondary | ICD-10-CM | POA: Diagnosis not present

## 2021-04-27 DIAGNOSIS — Z23 Encounter for immunization: Secondary | ICD-10-CM | POA: Diagnosis not present

## 2021-05-20 ENCOUNTER — Encounter: Payer: Self-pay | Admitting: Internal Medicine

## 2021-06-18 ENCOUNTER — Telehealth: Payer: Self-pay | Admitting: Physical Medicine and Rehabilitation

## 2021-06-18 DIAGNOSIS — Z9849 Cataract extraction status, unspecified eye: Secondary | ICD-10-CM | POA: Diagnosis not present

## 2021-06-18 DIAGNOSIS — H35033 Hypertensive retinopathy, bilateral: Secondary | ICD-10-CM | POA: Diagnosis not present

## 2021-06-18 DIAGNOSIS — H3530 Unspecified macular degeneration: Secondary | ICD-10-CM | POA: Diagnosis not present

## 2021-06-18 DIAGNOSIS — H353132 Nonexudative age-related macular degeneration, bilateral, intermediate dry stage: Secondary | ICD-10-CM | POA: Diagnosis not present

## 2021-06-18 DIAGNOSIS — I10 Essential (primary) hypertension: Secondary | ICD-10-CM | POA: Diagnosis not present

## 2021-06-18 DIAGNOSIS — H40013 Open angle with borderline findings, low risk, bilateral: Secondary | ICD-10-CM | POA: Diagnosis not present

## 2021-06-18 DIAGNOSIS — H524 Presbyopia: Secondary | ICD-10-CM | POA: Diagnosis not present

## 2021-06-18 DIAGNOSIS — H40043 Steroid responder, bilateral: Secondary | ICD-10-CM | POA: Diagnosis not present

## 2021-06-18 DIAGNOSIS — Z961 Presence of intraocular lens: Secondary | ICD-10-CM | POA: Diagnosis not present

## 2021-06-18 DIAGNOSIS — H04123 Dry eye syndrome of bilateral lacrimal glands: Secondary | ICD-10-CM | POA: Diagnosis not present

## 2021-06-18 NOTE — Telephone Encounter (Signed)
Patient called in requesting appt for epidural injection in legs lower left sides please call [phone]

## 2021-07-26 ENCOUNTER — Encounter: Payer: Medicare Other | Admitting: Physical Medicine and Rehabilitation

## 2021-08-05 ENCOUNTER — Telehealth: Payer: Self-pay | Admitting: Physical Medicine and Rehabilitation

## 2021-08-05 NOTE — Telephone Encounter (Signed)
Patient's wife Manuela Schwartz called needing to R/S Patient's appointment  [phone] or [phone]

## 2021-08-12 ENCOUNTER — Encounter: Payer: Medicare Other | Admitting: Physical Medicine and Rehabilitation

## 2021-08-16 ENCOUNTER — Telehealth: Payer: Self-pay | Admitting: Physical Medicine and Rehabilitation

## 2021-08-16 NOTE — Telephone Encounter (Signed)
Patient called needing to schedule an appointment with Dr. Lavada Mesi for an epidural injection. The number to contact patient is [phone]

## 2021-08-31 ENCOUNTER — Ambulatory Visit: Payer: Self-pay

## 2021-08-31 ENCOUNTER — Ambulatory Visit (INDEPENDENT_AMBULATORY_CARE_PROVIDER_SITE_OTHER): Payer: Medicare Other | Admitting: Physical Medicine and Rehabilitation

## 2021-08-31 ENCOUNTER — Encounter: Payer: Self-pay | Admitting: Physical Medicine and Rehabilitation

## 2021-08-31 VITALS — BP 134/75 | HR 64

## 2021-08-31 DIAGNOSIS — M25551 Pain in right hip: Secondary | ICD-10-CM

## 2021-08-31 DIAGNOSIS — M25552 Pain in left hip: Secondary | ICD-10-CM | POA: Diagnosis not present

## 2021-08-31 DIAGNOSIS — M48062 Spinal stenosis, lumbar region with neurogenic claudication: Secondary | ICD-10-CM | POA: Diagnosis not present

## 2021-08-31 DIAGNOSIS — M5416 Radiculopathy, lumbar region: Secondary | ICD-10-CM | POA: Diagnosis not present

## 2021-08-31 DIAGNOSIS — R202 Paresthesia of skin: Secondary | ICD-10-CM

## 2021-08-31 MED ORDER — METHYLPREDNISOLONE ACETATE 80 MG/ML IJ SUSP
80.0000 mg | Freq: Once | INTRAMUSCULAR | Status: AC
  Administered 2021-08-31: 80 mg

## 2021-08-31 NOTE — Patient Instructions (Signed)

## 2021-08-31 NOTE — Progress Notes (Signed)
Pt state lower back pain that travels to his buttocks and both legs. Pt state walking and standing makes the pain worse. Pt state he take pain meds to help ease his pain.  Numeric Pain Rating Scale and Functional Assessment Average Pain 3   In the last MONTH (on 0-10 scale) has pain interfered with the following?  1. General activity like being  able to carry out your everyday physical activities such as walking, climbing stairs, carrying groceries, or moving a chair?  Rating(8)   +Driver, -BT, -Dye Allergies.

## 2021-09-02 NOTE — Progress Notes (Signed)
Paul Herring - 81 y.o. male MRN [id number]  Date of birth: Jul 23, 1940  Office Visit Note: Visit Date: 08/31/2021 PCP: Ginger Organ., MD Referred by: Ginger Organ., MD  Subjective: Chief Complaint  Patient presents with   Lower Back - Pain   Right Leg - Pain   Left Leg - Pain   HPI: Paul Herring is a 81 y.o. male who comes in today for evaluation and management of chronic worsening severe low back pain with bilateral hip and leg pain.  Patient was originally seen many years ago as I was seeing his wife and I continue to see his wife.  She is present today and provides some of the history.  He has a known history of pretty severe lumbar stenosis at L4-5 with multilevel facet arthropathy.  He has a long history of back pain off and on does fairly well with minimal medication management but has periods where his symptoms seem to flareup greatly.  He reports to me that he pretty much puts up with it as long as he is not having pain down into the buttock or legs or prevents him from walking.  He reports over the last 4 months or so he has had increasing symptoms to the point where he is having difficulty at times walking and doing things.  He does have some paresthesias.  No history of neuropathy.  He does take some gabapentin which we provide a mild dosage.  Last injection was quite a while ago I have not seen him in some time.  Initially completed L4 transforaminal injections at the level stenosis but found out more recently after that the interlaminar injections below this level seem to do just as well.  He has not had any new trauma falls etc.  He does have hip pain no hip pain with rotation no groin pain.  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  He has had physical therapy in the past but not recently.  He does try to maintain activity level with home exercise.      Review of Systems  Musculoskeletal:  Positive for back pain and joint pain.  Neurological:   Positive for tingling and weakness.  All other systems reviewed and are negative.  Otherwise per HPI.  Assessment & Plan: Visit Diagnoses:    ICD-10-CM   1. Lumbar radiculopathy  M54.16 XR C-ARM NO REPORT    Epidural Steroid injection    methylPREDNISolone acetate (DEPO-MEDROL) injection 80 mg    2. Spinal stenosis of lumbar region with neurogenic claudication  M48.062     3. Pain in left hip  M25.552     4. Pain in right hip  M25.551     5. Paresthesia of skin  R20.2        Plan: Findings:  Chronic worsening severe low back pain with pain into both hips and sometimes legs severity recently really more pronounced without any new trauma or new other symptoms.  Known history of lumbar stenosis at L4-5 quite severe but with no focal weakness does feel weak in general.  Over the last several months significant worsening of symptoms with loss of functional ability.  In the past epidural injections have helped.  He does have facet arthropathy which is likely causing a lot of his axial back pain worse with standing and moving.  He typically deals with the axial back pain fairly well.  He also has some hip pain but no hip pain  to the groin.  Exam really negative for hip issues.  Does take gabapentin and over-the-counter medication.  Decided to complete L5-S1 interlaminar injection today.  Depending on relief would look at transforaminal approach.  Does not really need new MRI although we can consider that at some point depending on how he is doing.  Really does not anticipate surgical consideration at his age and his level of functioning is pretty well with current management.    Meds & Orders:  Meds ordered this encounter  Medications   methylPREDNISolone acetate (DEPO-MEDROL) injection 80 mg    Orders Placed This Encounter  Procedures   XR C-ARM NO REPORT   Epidural Steroid injection    Follow-up: Return for visit to requesting provider as needed.   Procedures: No procedures performed   Lumbar Epidural Steroid Injection - Interlaminar Approach with Fluoroscopic Guidance  Patient: Paul Herring      Date of Birth: [birth year] MRN: [id number] PCP: Ginger Organ., MD      Visit Date: 08/31/2021   Universal Protocol:     Consent Given By: the patient  Position: PRONE  Additional Comments: Vital signs were monitored before and after the procedure. Patient was prepped and draped in the usual sterile fashion. The correct patient, procedure, and site was verified.   Injection Procedure Details:   Procedure diagnoses: Lumbar radiculopathy [M54.16]   Meds Administered:  Meds ordered this encounter  Medications   methylPREDNISolone acetate (DEPO-MEDROL) injection 80 mg     Laterality: Left  Location/Site:  L5-S1  Needle: 3.5 in., 20 ga. Tuohy  Needle Placement: Paramedian epidural  Findings:   -Comments: Excellent flow of contrast into the epidural space.  Procedure Details: Using a paramedian approach from the side mentioned above, the region overlying the inferior lamina was localized under fluoroscopic visualization and the soft tissues overlying this structure were infiltrated with 4 ml. of 1% Lidocaine without Epinephrine. The Tuohy needle was inserted into the epidural space using a paramedian approach.   The epidural space was localized using loss of resistance along with counter oblique bi-planar fluoroscopic views.  After negative aspirate for air, blood, and CSF, a 2 ml. volume of Isovue-250 was injected into the epidural space and the flow of contrast was observed. Radiographs were obtained for documentation purposes.    The injectate was administered into the level noted above.   Additional Comments:  The patient tolerated the procedure well Dressing: 2 x 2 sterile gauze and Band-Aid    Post-procedure details: Patient was observed during the procedure. Post-procedure instructions were reviewed.  Patient left the clinic in  stable condition.   Clinical History: No specialty comments available.   He reports that he quit smoking about 45 years ago. He has never used smokeless tobacco. No results for input(s): "HGBA1C", "LABURIC" in the last 8760 hours.  Objective:  VS:  HT:    WT:   BMI:     BP:134/75  HR:64bpm  TEMP: ( )  RESP:  Physical Exam Vitals and nursing note reviewed.  Constitutional:      General: He is not in acute distress.    Appearance: Normal appearance. He is well-developed. He is not ill-appearing.  HENT:     Head: Normocephalic and atraumatic.     Right Ear: External ear normal.     Left Ear: External ear normal.     Nose: No congestion.  Eyes:     Extraocular Movements: Extraocular movements intact.     Conjunctiva/sclera: Conjunctivae normal.  Pupils: Pupils are equal, round, and reactive to light.  Cardiovascular:     Rate and Rhythm: Normal rate.     Pulses: Normal pulses.     Heart sounds: Normal heart sounds.  Pulmonary:     Effort: Pulmonary effort is normal. No respiratory distress.  Abdominal:     General: There is no distension.     Palpations: Abdomen is soft.  Musculoskeletal:        General: No tenderness or signs of injury.     Cervical back: Normal range of motion and neck supple. No rigidity.     Right lower leg: No edema.     Left lower leg: No edema.     Comments: Patient has good distal strength without clonus.  Mild pain over the greater trochanters.  No pain with hip rotation.  Somewhat slow to rise from seated position with pain on lumbar extension.  Skin:    General: Skin is warm and dry.     Findings: No erythema or rash.  Neurological:     General: No focal deficit present.     Mental Status: He is alert and oriented to person, place, and time.     Sensory: No sensory deficit.     Motor: No weakness or abnormal muscle tone.     Coordination: Coordination normal.     Gait: Gait normal.  Psychiatric:        Mood and Affect: Mood normal.         Behavior: Behavior normal.     Ortho Exam  Imaging: No results found.  Past Medical/Family/Surgical/Social History: Medications & Allergies reviewed per EMR, new medications updated. Patient Active Problem List   Diagnosis Date Noted   Lumbar radiculopathy 03/21/2019   Spinal stenosis of lumbar region with neurogenic claudication 03/21/2019   Spondylosis without myelopathy or radiculopathy, lumbar region 03/21/2019   Pain in left ankle and joints of left foot 03/21/2019   Past Medical History:  Diagnosis Date   Arthritis    Cataract    Chronic kidney disease    Enlarged prostate    GERD (gastroesophageal reflux disease)    Hyperlipidemia    Hypertension    Family History  Problem Relation Age of Onset   Colon cancer Neg Hx    Esophageal cancer Neg Hx    Rectal cancer Neg Hx    Stomach cancer Neg Hx    Past Surgical History:  Procedure Laterality Date   ANGIOGRAM/LV (CONGENITAL)     Bladder Stent     COLONOSCOPY     POLYPECTOMY     TONSILLECTOMY     Social History   Occupational History   Not on file  Tobacco Use   Smoking status: Former    Types: Cigarettes    Quit date: 1978    Years since quitting: 45.6   Smokeless tobacco: Never  Substance and Sexual Activity   Alcohol use: Yes    Comment: 2-3 glassses of wine    Drug use: No   Sexual activity: Not on file

## 2021-09-03 NOTE — Procedures (Signed)
Lumbar Epidural Steroid Injection - Interlaminar Approach with Fluoroscopic Guidance  Patient: Paul Herring      Date of Birth: 1940-11-30 MRN: [id number] PCP: Ginger Organ., MD      Visit Date: 08/31/2021   Universal Protocol:     Consent Given By: the patient  Position: PRONE  Additional Comments: Vital signs were monitored before and after the procedure. Patient was prepped and draped in the usual sterile fashion. The correct patient, procedure, and site was verified.   Injection Procedure Details:   Procedure diagnoses: Lumbar radiculopathy [M54.16]   Meds Administered:  Meds ordered this encounter  Medications   methylPREDNISolone acetate (DEPO-MEDROL) injection 80 mg     Laterality: Left  Location/Site:  L5-S1  Needle: 3.5 in., 20 ga. Tuohy  Needle Placement: Paramedian epidural  Findings:   -Comments: Excellent flow of contrast into the epidural space.  Procedure Details: Using a paramedian approach from the side mentioned above, the region overlying the inferior lamina was localized under fluoroscopic visualization and the soft tissues overlying this structure were infiltrated with 4 ml. of 1% Lidocaine without Epinephrine. The Tuohy needle was inserted into the epidural space using a paramedian approach.   The epidural space was localized using loss of resistance along with counter oblique bi-planar fluoroscopic views.  After negative aspirate for air, blood, and CSF, a 2 ml. volume of Isovue-250 was injected into the epidural space and the flow of contrast was observed. Radiographs were obtained for documentation purposes.    The injectate was administered into the level noted above.   Additional Comments:  The patient tolerated the procedure well Dressing: 2 x 2 sterile gauze and Band-Aid    Post-procedure details: Patient was observed during the procedure. Post-procedure instructions were reviewed.  Patient left the clinic in stable  condition.

## 2021-09-15 ENCOUNTER — Telehealth: Payer: Self-pay | Admitting: Physical Medicine and Rehabilitation

## 2021-09-15 NOTE — Telephone Encounter (Signed)
Pt called and states that the injection did not help and he would like megan or newton to give him a call back.

## 2021-09-17 DIAGNOSIS — Z23 Encounter for immunization: Secondary | ICD-10-CM | POA: Diagnosis not present

## 2021-09-20 ENCOUNTER — Telehealth: Payer: Self-pay | Admitting: Physical Medicine and Rehabilitation

## 2021-09-20 NOTE — Telephone Encounter (Signed)
Patient called wanting to make sure he does not need a driver for his appointment.  Patient said he may have to reschedule his appointment until next week. The number to contact patient is [phone]

## 2021-09-20 NOTE — Telephone Encounter (Signed)
IC advised no driver needed-scheduled for OV with Megan

## 2021-09-22 ENCOUNTER — Encounter: Payer: Self-pay | Admitting: Physical Medicine and Rehabilitation

## 2021-09-22 ENCOUNTER — Ambulatory Visit (INDEPENDENT_AMBULATORY_CARE_PROVIDER_SITE_OTHER): Payer: Medicare Other | Admitting: Physical Medicine and Rehabilitation

## 2021-09-22 DIAGNOSIS — M4726 Other spondylosis with radiculopathy, lumbar region: Secondary | ICD-10-CM

## 2021-09-22 DIAGNOSIS — M5416 Radiculopathy, lumbar region: Secondary | ICD-10-CM | POA: Diagnosis not present

## 2021-09-22 DIAGNOSIS — M47816 Spondylosis without myelopathy or radiculopathy, lumbar region: Secondary | ICD-10-CM | POA: Diagnosis not present

## 2021-09-22 DIAGNOSIS — M48062 Spinal stenosis, lumbar region with neurogenic claudication: Secondary | ICD-10-CM | POA: Diagnosis not present

## 2021-09-22 MED ORDER — DIAZEPAM 5 MG PO TABS: ORAL_TABLET | ORAL | 0 refills | Status: AC

## 2021-09-22 NOTE — Progress Notes (Signed)
Paul Herring - 81 y.o. male MRN [id number]  Date of birth: Dec 27, 1940  Office Visit Note: Visit Date: 09/22/2021 PCP: Ginger Organ., MD Referred by: Ginger Organ., MD  Subjective: Chief Complaint  Patient presents with   Lower Back - Pain   HPI: Paul Herring is a 81 y.o. male who comes in today for evaluation of chronic, worsening and severe left-sided lower back pain radiating to bilateral legs.  Pain ongoing for several years and is exacerbated by prolonged standing and walking. Patient states he takes frequent breaks when walking and performing tasks.  He describes his pain as a sore and aching sensation, currently rates as 8 out of 10. Some relief of home exercise regimen, rest and medications. Does take Tylenol as needed. Lumbar MRI imaging from 2015 exhibits moderate facet hypertrophy and severe spinal canal stenosis at the level of L4-L5. Patient recent underwent left L5-S1 interlaminar epidural steroid injection in our office on 08/31/2021, he reports 100% relief of pain for 2-3 days following injection. We have also performed multiple transforaminal epidural steroid injections in our office in 2017 and 2020 with good relief of pain. Patient states he does walk some, however long term affects from COVID prevent him from exercising on regular basis. Patient states he stopped taking Gabapentin several months ago as he learned about concerning side effects from this medication. Patient does help care for his wife at home. Patient denies focal weakness, numbness and tingling. Patient denies recent trauma or falls.    Review of Systems  Musculoskeletal:  Positive for back pain.  Neurological:  Negative for tingling, sensory change, focal weakness and weakness.  All other systems reviewed and are negative.  Otherwise per HPI.  Assessment & Plan: Visit Diagnoses:    ICD-10-CM   1. Lumbar radiculopathy  M54.16 Ambulatory referral to Physical Medicine Rehab     2. Spinal stenosis of lumbar region with neurogenic claudication  M48.062 Ambulatory referral to Physical Medicine Rehab    3. Other spondylosis with radiculopathy, lumbar region  M47.26 Ambulatory referral to Physical Medicine Rehab    4. Facet hypertrophy of lumbar region  M47.816 Ambulatory referral to Physical Medicine Rehab       Plan: Findings:  Chronic, worsening and severe left-sided lower back pain radiating to bilateral legs.  Patient continues to have severe pain despite good conservative therapy such as home exercise regimen, rest and use of medications.  Patient's clinical presentation and exam are consistent with neurogenic claudication as a result of spinal canal stenosis.  He does have severe spinal canal stenosis at the level of L4-L5. Next step is to perform diagnostic and hopefully therapeutic left L4 transforaminal epidural steroid injection under fluoroscopic guidance. Patient does voice anxiety related to injection procedure, I did prescribe pre-procedure sedation with Valium today for him to take on day of injection. If significant and sustained relief with injection we can repeat this procedure infrequently as needed.  If his pain persists or worsens we would consider obtaining new lumbar MRI imaging. No red flag symptoms noted upon exam today.     Meds & Orders:  Meds ordered this encounter  Medications   diazepam (VALIUM) 5 MG tablet    Sig: Take one tablet by mouth with food one hour prior to procedure. May repeat 30 minutes prior if needed.    Dispense:  2 tablet    Refill:  0    Orders Placed This Encounter  Procedures   Ambulatory referral  to Physical Medicine Rehab    Follow-up: Return for Left L4 transforaminal epidural steroid injection.   Procedures: No procedures performed      Clinical History: EXAM:  MRI LUMBAR SPINE WITHOUT CONTRAST   TECHNIQUE:  Multiplanar, multisequence MR imaging of the lumbar spine was  performed. No intravenous contrast  was administered.   COMPARISON:  None.   FINDINGS:  Leftward curvature of the lumbar spine is centered at L3. Chronic  endplate marrow changes are present from L1-2 through L5-S1, more  pronounced on the right except at L5-S1 were changes are more  prominent on the left.   Limited imaging of the abdomen is unremarkable.   Normal signal is present in the conus medullaris which terminates at  L1.   L1-2: Mild disc bulging is asymmetric to the left. There is no  significant stenosis.   L2-3: A broad-based disc herniation is present. Mild facet  hypertrophy is noted bilaterally. This results in mild lateral  recess narrowing bilaterally.   L3-4: A broad-based disc herniation is present. Moderate facet  hypertrophy is noted. Mild lateral recess and foraminal narrowing is  present bilaterally.   L4-5: This is the most severe level. A leftward disc protrusion is  present. Moderate facet hypertrophy is present bilaterally. This  results in severe central canal stenosis, left greater than right.  Moderate left and mild right foraminal stenosis is present  bilaterally.   L5-S1: A rightward disc protrusion is present. Asymmetric  right-sided facet hypertrophy is noted. This results in mild right  foraminal narrowing. The central canal and left foramen are patent.   IMPRESSION:  1. Severe central canal stenosis at L4-5 secondary to a low left  paramedian disc protrusion and moderate facet hypertrophy.  2. Moderate left and mild right foraminal stenosis at L4-5.  3. 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  4. Mild lateral recess and foraminal narrowing bilaterally at L2-3  and L3-4.  5. Mild right foraminal narrowing at L5-S1    Electronically Signed    By: Lawrence Santiago M.D.    On: 05/29/2013 21:09   He reports that he quit smoking about 45 years ago. His smoking use included cigarettes. He has never used smokeless tobacco. No results for input(s): "HGBA1C",  "LABURIC" in the last 8760 hours.  Objective:  VS:  HT:    WT:   BMI:     BP:   HR: bpm  TEMP: ( )  RESP:  Physical Exam Vitals and nursing note reviewed.  HENT:     Head: Normocephalic and atraumatic.     Right Ear: External ear normal.     Left Ear: External ear normal.     Nose: Nose normal.     Mouth/Throat:     Mouth: Mucous membranes are moist.  Eyes:     Extraocular Movements: Extraocular movements intact.  Cardiovascular:     Rate and Rhythm: Normal rate.     Pulses: Normal pulses.  Pulmonary:     Effort: Pulmonary effort is normal.  Abdominal:     General: Abdomen is flat. There is no distension.  Musculoskeletal:        General: Tenderness present.     Cervical back: Normal range of motion.     Comments: Pt rises from seated position to standing without difficulty. Good lumbar range of motion. Strong distal strength without clonus, no pain upon palpation of greater trochanters. Sensation intact bilaterally. Walks independently, gait steady.   Skin:  General: Skin is warm and dry.     Capillary Refill: Capillary refill takes less than 2 seconds.  Neurological:     General: No focal deficit present.     Mental Status: He is alert and oriented to person, place, and time.  Psychiatric:        Mood and Affect: Mood normal.        Behavior: Behavior normal.     Ortho Exam  Imaging: No results found.  Past Medical/Family/Surgical/Social History: Medications & Allergies reviewed per EMR, new medications updated. Patient Active Problem List   Diagnosis Date Noted   Lumbar radiculopathy 03/21/2019   Spinal stenosis of lumbar region with neurogenic claudication 03/21/2019   Spondylosis without myelopathy or radiculopathy, lumbar region 03/21/2019   Pain in left ankle and joints of left foot 03/21/2019   Past Medical History:  Diagnosis Date   Arthritis    Cataract    Chronic kidney disease    Enlarged prostate    GERD (gastroesophageal reflux disease)     Hyperlipidemia    Hypertension    Family History  Problem Relation Age of Onset   Colon cancer Neg Hx    Esophageal cancer Neg Hx    Rectal cancer Neg Hx    Stomach cancer Neg Hx    Past Surgical History:  Procedure Laterality Date   ANGIOGRAM/LV (CONGENITAL)     Bladder Stent     COLONOSCOPY     POLYPECTOMY     TONSILLECTOMY     Social History   Occupational History   Not on file  Tobacco Use   Smoking status: Former    Types: Cigarettes    Quit date: 1978    Years since quitting: 45.7   Smokeless tobacco: Never  Substance and Sexual Activity   Alcohol use: Yes    Comment: 2-3 glassses of wine    Drug use: No   Sexual activity: Not on file

## 2021-09-22 NOTE — Progress Notes (Signed)
Numeric Pain Rating Scale and Functional Assessment Average Pain 5   In the last MONTH (on 0-10 scale) has pain interfered with the following?  1. General activity like being  able to carry out your everyday physical activities such as walking, climbing stairs, carrying groceries, or moving a chair?  Rating(8)   States still having the back pain, which he can tolerate, but the problem is leg pain.  No numbness or tingling. Hurting while walking from one end of the street and back and it affects his balance.  Injection helped for 5 days.  Takes Tylenol and Tramadol when his back really hurts.

## 2021-09-28 ENCOUNTER — Telehealth: Payer: Self-pay | Admitting: Physical Medicine and Rehabilitation

## 2021-09-28 NOTE — Telephone Encounter (Signed)
I talked with patient. He had concerns around Valium rx from 09/20 needing auth.  He also is asking for script to go to [facility] to see Aart for dry needling. He said they could see him on 10/03. If ok with this please provide script and we can get faxed.

## 2021-09-28 NOTE — Telephone Encounter (Signed)
Has a question Med, please call the [phone]

## 2021-09-28 NOTE — Telephone Encounter (Signed)
IC advised.  

## 2021-10-05 DIAGNOSIS — M545 Low back pain, unspecified: Secondary | ICD-10-CM | POA: Diagnosis not present

## 2021-10-07 ENCOUNTER — Encounter: Payer: Medicare Other | Admitting: Physical Medicine and Rehabilitation

## 2021-10-11 ENCOUNTER — Ambulatory Visit (INDEPENDENT_AMBULATORY_CARE_PROVIDER_SITE_OTHER): Payer: Medicare Other | Admitting: Physical Medicine and Rehabilitation

## 2021-10-11 ENCOUNTER — Encounter: Payer: Self-pay | Admitting: Physical Medicine and Rehabilitation

## 2021-10-11 ENCOUNTER — Ambulatory Visit: Payer: Self-pay

## 2021-10-11 VITALS — BP 119/73 | HR 65

## 2021-10-11 DIAGNOSIS — M5416 Radiculopathy, lumbar region: Secondary | ICD-10-CM

## 2021-10-11 MED ORDER — METHYLPREDNISOLONE ACETATE 80 MG/ML IJ SUSP
80.0000 mg | Freq: Once | INTRAMUSCULAR | Status: AC
  Administered 2021-10-11: 80 mg

## 2021-10-11 NOTE — Patient Instructions (Signed)

## 2021-10-11 NOTE — Progress Notes (Signed)
Numeric Pain Rating Scale and Functional Assessment Average Pain 6   In the last MONTH (on 0-10 scale) has pain interfered with the following?  1. General activity like being  able to carry out your everyday physical activities such as walking, climbing stairs, carrying groceries, or moving a chair?  Rating(8)   +Driver, -BT, -Dye Allergies.   Back pain that radiates normally on the left side , but now whole back and right side are hurting. Used heat on left side.  Saw physical therapist last week

## 2021-10-18 NOTE — Progress Notes (Signed)
Paul Herring - 81 y.o. male MRN [id number]  Date of birth: 1940-02-11  Office Visit Note: Visit Date: 10/11/2021 PCP: Paul Herring., MD Referred by: Paul Herring., MD  Subjective: Chief Complaint  Patient presents with   Lower Back - Pain   HPI:  Paul Herring is a 81 y.o. male who comes in today at the request of Barnet Pall, FNP for planned Left L4-5 Lumbar Transforaminal epidural steroid injection with fluoroscopic guidance.  The patient has failed conservative care including home exercise, medications, time and activity modification.  This injection will be diagnostic and hopefully therapeutic.  Please see requesting physician notes for further details and justification.   ROS Otherwise per HPI.  Assessment & Plan: Visit Diagnoses:    ICD-10-CM   1. Lumbar radiculopathy  M54.16 XR C-ARM NO REPORT    Epidural Steroid injection    methylPREDNISolone acetate (DEPO-MEDROL) injection 80 mg      Plan: No additional findings.   Meds & Orders:  Meds ordered this encounter  Medications   methylPREDNISolone acetate (DEPO-MEDROL) injection 80 mg    Orders Placed This Encounter  Procedures   XR C-ARM NO REPORT   Epidural Steroid injection    Follow-up: Return for visit to requesting provider as needed.   Procedures: No procedures performed  Lumbosacral Transforaminal Epidural Steroid Injection - Sub-Pedicular Approach with Fluoroscopic Guidance  Patient: Paul Herring      Date of Birth: Apr 24, 1940 MRN: [id number] PCP: Paul Herring., MD      Visit Date: 10/11/2021   Universal Protocol:    Date/Time: 10/11/2021  Consent Given By: the patient  Position: PRONE  Additional Comments: Vital signs were monitored before and after the procedure. Patient was prepped and draped in the usual sterile fashion. The correct patient, procedure, and site was verified.   Injection Procedure Details:   Procedure diagnoses: Lumbar  radiculopathy [M54.16]    Meds Administered:  Meds ordered this encounter  Medications   methylPREDNISolone acetate (DEPO-MEDROL) injection 80 mg    Laterality: Left  Location/Site: L4  Needle:5.0 in., 22 ga.  Short bevel or Quincke spinal needle  Needle Placement: Transforaminal  Findings:    -Comments: Excellent flow of contrast along the nerve, nerve root and into the epidural space.  Procedure Details: After squaring off the end-plates to get a true AP view, the C-arm was positioned so that an oblique view of the foramen as noted above was visualized. The target area is just inferior to the "nose of the scotty dog" or sub pedicular. The soft tissues overlying this structure were infiltrated with 2-3 ml. of 1% Lidocaine without Epinephrine.  The spinal needle was inserted toward the target using a "trajectory" view along the fluoroscope beam.  Under AP and lateral visualization, the needle was advanced so it did not puncture dura and was located close the 6 O'Clock position of the pedical in AP tracterory. Biplanar projections were used to confirm position. Aspiration was confirmed to be negative for CSF and/or blood. A 1-2 ml. volume of Isovue-250 was injected and flow of contrast was noted at each level. Radiographs were obtained for documentation purposes.   After attaining the desired flow of contrast documented above, a 0.5 to 1.0 ml test dose of 0.25% Marcaine was injected into each respective transforaminal space.  The patient was observed for 90 seconds post injection.  After no sensory deficits were reported, and normal lower extremity motor function was noted,  the above injectate was administered so that equal amounts of the injectate were placed at each foramen (level) into the transforaminal epidural space.   Additional Comments:  The patient tolerated the procedure well Dressing: 2 x 2 sterile gauze and Band-Aid    Post-procedure details: Patient was observed  during the procedure. Post-procedure instructions were reviewed.  Patient left the clinic in stable condition.    Clinical History: EXAM:  MRI LUMBAR SPINE WITHOUT CONTRAST   TECHNIQUE:  Multiplanar, multisequence MR imaging of the lumbar spine was  performed. No intravenous contrast was administered.   COMPARISON:  None.   FINDINGS:  Leftward curvature of the lumbar spine is centered at L3. Chronic  endplate marrow changes are present from L1-2 through L5-S1, more  pronounced on the right except at L5-S1 were changes are more  prominent on the left.   Limited imaging of the abdomen is unremarkable.   Normal signal is present in the conus medullaris which terminates at  L1.   L1-2: Mild disc bulging is asymmetric to the left. There is no  significant stenosis.   L2-3: A broad-based disc herniation is present. Mild facet  hypertrophy is noted bilaterally. This results in mild lateral  recess narrowing bilaterally.   L3-4: A broad-based disc herniation is present. Moderate facet  hypertrophy is noted. Mild lateral recess and foraminal narrowing is  present bilaterally.   L4-5: This is the most severe level. A leftward disc protrusion is  present. Moderate facet hypertrophy is present bilaterally. This  results in severe central canal stenosis, left greater than right.  Moderate left and mild right foraminal stenosis is present  bilaterally.   L5-S1: A rightward disc protrusion is present. Asymmetric  right-sided facet hypertrophy is noted. This results in mild right  foraminal narrowing. The central canal and left foramen are patent.   IMPRESSION:  1. Severe central canal stenosis at L4-5 secondary to a low left  paramedian disc protrusion and moderate facet hypertrophy.  2. Moderate left and mild right foraminal stenosis at L4-5.  3. 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  4. Mild lateral recess and foraminal narrowing bilaterally at L2-3  and  L3-4.  5. Mild right foraminal narrowing at L5-S1    Electronically Signed    By: Lawrence Santiago M.D.    On: 05/29/2013 21:09     Objective:  VS:  HT:    WT:   BMI:     BP:119/73  HR:65bpm  TEMP: ( )  RESP:  Physical Exam Vitals and nursing note reviewed.  Constitutional:      General: He is not in acute distress.    Appearance: Normal appearance. He is not ill-appearing.  HENT:     Head: Normocephalic and atraumatic.     Right Ear: External ear normal.     Left Ear: External ear normal.     Nose: No congestion.  Eyes:     Extraocular Movements: Extraocular movements intact.  Cardiovascular:     Rate and Rhythm: Normal rate.     Pulses: Normal pulses.  Pulmonary:     Effort: Pulmonary effort is normal. No respiratory distress.  Abdominal:     General: There is no distension.     Palpations: Abdomen is soft.  Musculoskeletal:        General: No tenderness or signs of injury.     Cervical back: Neck supple.     Right lower leg: No edema.     Left  lower leg: No edema.     Comments: Patient has good distal strength without clonus.  Skin:    Findings: No erythema or rash.  Neurological:     General: No focal deficit present.     Mental Status: He is alert and oriented to person, place, and time.     Sensory: No sensory deficit.     Motor: No weakness or abnormal muscle tone.     Coordination: Coordination normal.  Psychiatric:        Mood and Affect: Mood normal.        Behavior: Behavior normal.      Imaging: No results found.

## 2021-10-18 NOTE — Procedures (Signed)
Lumbosacral Transforaminal Epidural Steroid Injection - Sub-Pedicular Approach with Fluoroscopic Guidance  Patient: Paul Herring      Date of Birth: 09/08/1940 MRN: [id number] PCP: Ginger Organ., MD      Visit Date: 10/11/2021   Universal Protocol:    Date/Time: 10/11/2021  Consent Given By: the patient  Position: PRONE  Additional Comments: Vital signs were monitored before and after the procedure. Patient was prepped and draped in the usual sterile fashion. The correct patient, procedure, and site was verified.   Injection Procedure Details:   Procedure diagnoses: Lumbar radiculopathy [M54.16]    Meds Administered:  Meds ordered this encounter  Medications   methylPREDNISolone acetate (DEPO-MEDROL) injection 80 mg    Laterality: Left  Location/Site: L4  Needle:5.0 in., 22 ga.  Short bevel or Quincke spinal needle  Needle Placement: Transforaminal  Findings:    -Comments: Excellent flow of contrast along the nerve, nerve root and into the epidural space.  Procedure Details: After squaring off the end-plates to get a true AP view, the C-arm was positioned so that an oblique view of the foramen as noted above was visualized. The target area is just inferior to the "nose of the scotty dog" or sub pedicular. The soft tissues overlying this structure were infiltrated with 2-3 ml. of 1% Lidocaine without Epinephrine.  The spinal needle was inserted toward the target using a "trajectory" view along the fluoroscope beam.  Under AP and lateral visualization, the needle was advanced so it did not puncture dura and was located close the 6 O'Clock position of the pedical in AP tracterory. Biplanar projections were used to confirm position. Aspiration was confirmed to be negative for CSF and/or blood. A 1-2 ml. volume of Isovue-250 was injected and flow of contrast was noted at each level. Radiographs were obtained for documentation purposes.   After attaining the  desired flow of contrast documented above, a 0.5 to 1.0 ml test dose of 0.25% Marcaine was injected into each respective transforaminal space.  The patient was observed for 90 seconds post injection.  After no sensory deficits were reported, and normal lower extremity motor function was noted,   the above injectate was administered so that equal amounts of the injectate were placed at each foramen (level) into the transforaminal epidural space.   Additional Comments:  The patient tolerated the procedure well Dressing: 2 x 2 sterile gauze and Band-Aid    Post-procedure details: Patient was observed during the procedure. Post-procedure instructions were reviewed.  Patient left the clinic in stable condition.

## 2021-10-29 ENCOUNTER — Telehealth: Payer: Self-pay

## 2021-10-29 NOTE — Telephone Encounter (Signed)
Patient called and stated the shot worked and he has no pain on the left side of his back nor his leg. He does still have pain on the right, but he is going to make an appointment

## 2021-11-04 DIAGNOSIS — M545 Low back pain, unspecified: Secondary | ICD-10-CM | POA: Diagnosis not present

## 2021-11-30 ENCOUNTER — Telehealth: Payer: Self-pay | Admitting: Physical Medicine and Rehabilitation

## 2021-11-30 DIAGNOSIS — M47816 Spondylosis without myelopathy or radiculopathy, lumbar region: Secondary | ICD-10-CM

## 2021-11-30 DIAGNOSIS — M5416 Radiculopathy, lumbar region: Secondary | ICD-10-CM

## 2021-11-30 DIAGNOSIS — M48062 Spinal stenosis, lumbar region with neurogenic claudication: Secondary | ICD-10-CM

## 2021-11-30 DIAGNOSIS — G8929 Other chronic pain: Secondary | ICD-10-CM

## 2021-11-30 NOTE — Telephone Encounter (Signed)
Saw patient today while seeing his wife. Old MRI 2015 needs update for continued chronic back and leg pain with gait abnormality

## 2021-12-01 NOTE — Telephone Encounter (Signed)
noted 

## 2021-12-18 ENCOUNTER — Inpatient Hospital Stay: Admission: RE | Admit: 2021-12-18 | Payer: Medicare Other | Source: Ambulatory Visit

## 2022-01-12 ENCOUNTER — Ambulatory Visit
Admission: RE | Admit: 2022-01-12 | Discharge: 2022-01-12 | Disposition: A | Discharge status: Home / Self Care | Payer: Medicare Other | Source: Ambulatory Visit | Attending: Physical Medicine and Rehabilitation | Admitting: Physical Medicine and Rehabilitation

## 2022-01-12 DIAGNOSIS — M545 Low back pain, unspecified: Secondary | ICD-10-CM | POA: Diagnosis not present

## 2022-01-12 DIAGNOSIS — M48061 Spinal stenosis, lumbar region without neurogenic claudication: Secondary | ICD-10-CM | POA: Diagnosis not present

## 2022-01-13 ENCOUNTER — Telehealth: Payer: Self-pay | Admitting: Physical Medicine and Rehabilitation

## 2022-01-13 NOTE — Telephone Encounter (Signed)
I reviewed the patient's new MRI and there shows mildly progressive changes but nothing significant in terms of worrisome findings other than what he already had which is multilevel arthritis as well as stenosis at L4-5 mainly.  Please call the patient and let him know these results but also we can follow-up with him as an office visit to go over the pictures and review how he is doing or see what he would like to do.

## 2022-01-14 NOTE — Telephone Encounter (Signed)
Spoke with patient and he states his back is way better than it was before. He states he would like to hold off on anything since there is no significant changes. He would like to know if he can have something for the arthritic pain he has. Please advise

## 2022-01-14 NOTE — Telephone Encounter (Signed)
Spoke with patient and informed him on how to take ES Tylenol. Verbalized understanding

## 2022-02-28 ENCOUNTER — Telehealth: Payer: Self-pay | Admitting: Physical Medicine and Rehabilitation

## 2022-02-28 NOTE — Telephone Encounter (Signed)
Patient called needing to schedule an appointment with Dr. Ernestina Patches for his back. The number to contact patient is [phone]

## 2022-03-01 NOTE — Telephone Encounter (Signed)
Spoke with patient and he is requesting another injection. He stated he is having the same type of pain and the last injection from 10/2021 lasted about 4 months. He stated he received good relief until recently. He wants injection to be right where the issue is in his back. No new falls, injuries or accidents. Please advise

## 2022-03-02 ENCOUNTER — Telehealth: Payer: Self-pay | Admitting: Physical Medicine and Rehabilitation

## 2022-03-02 ENCOUNTER — Other Ambulatory Visit: Payer: Self-pay | Admitting: Physical Medicine and Rehabilitation

## 2022-03-02 DIAGNOSIS — M5416 Radiculopathy, lumbar region: Secondary | ICD-10-CM

## 2022-03-02 NOTE — Telephone Encounter (Signed)
Spoke with patient and scheduled injection for 03/10/22

## 2022-03-02 NOTE — Telephone Encounter (Signed)
Pt called states he is waiting for approval for injection. If any problems with [employer] number call him and he will update new number. Pt phone number is [phone].

## 2022-03-10 ENCOUNTER — Ambulatory Visit (INDEPENDENT_AMBULATORY_CARE_PROVIDER_SITE_OTHER): Payer: Medicare Other | Admitting: Physical Medicine and Rehabilitation

## 2022-03-10 ENCOUNTER — Ambulatory Visit: Payer: Self-pay

## 2022-03-10 VITALS — BP 129/73 | HR 77

## 2022-03-10 DIAGNOSIS — M48062 Spinal stenosis, lumbar region with neurogenic claudication: Secondary | ICD-10-CM | POA: Diagnosis not present

## 2022-03-10 DIAGNOSIS — M5416 Radiculopathy, lumbar region: Secondary | ICD-10-CM

## 2022-03-10 MED ORDER — METHYLPREDNISOLONE ACETATE 80 MG/ML IJ SUSP
80.0000 mg | Freq: Once | INTRAMUSCULAR | Status: AC
  Administered 2022-03-10: 80 mg

## 2022-03-10 NOTE — Patient Instructions (Signed)

## 2022-03-10 NOTE — Progress Notes (Signed)
Functional Pain Scale - descriptive words and definitions  Distracting (5)    Aware of pain/able to complete some ADL's but limited by pain/sleep is affected and active distractions are only slightly useful. Moderate range order  Average Pain 6   +Driver, -BT, -Dye Allergies.  Lower back pain on both sides that radiates into both legs, walking and standing makes pain worse

## 2022-03-21 NOTE — Progress Notes (Signed)
Kingston Nelli - 82 y.o. male MRN [id number]  Date of birth: [birth year]  Office Visit Note: Visit Date: 03/10/2022 PCP: Ginger Organ., MD Referred by: Ginger Organ., MD  Subjective: Chief Complaint  Patient presents with   Lower Back - Pain   HPI:  Senon Joynes is a 82 y.o. male who comes in today at the request of Barnet Pall, FNP for planned Bilateral L4-5 Lumbar Transforaminal epidural steroid injection with fluoroscopic guidance.  The patient has failed conservative care including home exercise, medications, time and activity modification.  This injection will be diagnostic and hopefully therapeutic.  Please see requesting physician notes for further details and justification.   ROS Otherwise per HPI.  Assessment & Plan: Visit Diagnoses:    ICD-10-CM   1. Lumbar radiculopathy  M54.16 XR C-ARM NO REPORT    Epidural Steroid injection    methylPREDNISolone acetate (DEPO-MEDROL) injection 80 mg    2. Spinal stenosis of lumbar region with neurogenic claudication  M48.062 XR C-ARM NO REPORT    Epidural Steroid injection    methylPREDNISolone acetate (DEPO-MEDROL) injection 80 mg      Plan: No additional findings.   Meds & Orders:  Meds ordered this encounter  Medications   methylPREDNISolone acetate (DEPO-MEDROL) injection 80 mg    Orders Placed This Encounter  Procedures   XR C-ARM NO REPORT   Epidural Steroid injection    Follow-up: Return for visit to requesting provider as needed.   Procedures: No procedures performed  Lumbosacral Transforaminal Epidural Steroid Injection - Sub-Pedicular Approach with Fluoroscopic Guidance  Patient: Montrey Ekleberry      Date of Birth: Dec 23, 1940 MRN: [id number] PCP: Ginger Organ., MD      Visit Date: 03/10/2022   Universal Protocol:    Date/Time: 03/10/2022  Consent Given By: the patient  Position: PRONE  Additional Comments: Vital signs were monitored before and after the  procedure. Patient was prepped and draped in the usual sterile fashion. The correct patient, procedure, and site was verified.   Injection Procedure Details:   Procedure diagnoses: Lumbar radiculopathy [M54.16]    Meds Administered:  Meds ordered this encounter  Medications   methylPREDNISolone acetate (DEPO-MEDROL) injection 80 mg    Laterality: Bilateral  Location/Site: L4  Needle:5.0 in., 22 ga.  Short bevel or Quincke spinal needle  Needle Placement: Transforaminal  Findings:    -Comments: Excellent flow of contrast along the nerve, nerve root and into the epidural space.  Procedure Details: After squaring off the end-plates to get a true AP view, the C-arm was positioned so that an oblique view of the foramen as noted above was visualized. The target area is just inferior to the "nose of the scotty dog" or sub pedicular. The soft tissues overlying this structure were infiltrated with 2-3 ml. of 1% Lidocaine without Epinephrine.  The spinal needle was inserted toward the target using a "trajectory" view along the fluoroscope beam.  Under AP and lateral visualization, the needle was advanced so it did not puncture dura and was located close the 6 O'Clock position of the pedical in AP tracterory. Biplanar projections were used to confirm position. Aspiration was confirmed to be negative for CSF and/or blood. A 1-2 ml. volume of Isovue-250 was injected and flow of contrast was noted at each level. Radiographs were obtained for documentation purposes.   After attaining the desired flow of contrast documented above, a 0.5 to 1.0 ml test dose of 0.25% Marcaine was  injected into each respective transforaminal space.  The patient was observed for 90 seconds post injection.  After no sensory deficits were reported, and normal lower extremity motor function was noted,   the above injectate was administered so that equal amounts of the injectate were placed at each foramen (level) into the  transforaminal epidural space.   Additional Comments:  No complications occurred Dressing: 2 x 2 sterile gauze and Band-Aid    Post-procedure details: Patient was observed during the procedure. Post-procedure instructions were reviewed.  Patient left the clinic in stable condition.    Clinical History: MRI LUMBAR SPINE WITHOUT CONTRAST   TECHNIQUE: Multiplanar, multisequence MR imaging of the lumbar spine was performed. No intravenous contrast was administered.   COMPARISON:  05/29/2013   FINDINGS: Segmentation:  5 lumbar type vertebral bodies.   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 No antero or retrolisthesis.   Vertebrae: Edematous degenerative endplate changes on the right at L1-2. Chronic endplate marrow changes at the other levels without edema. Benign appearing hemangioma or lipoma within the left posterior vertebral body at L2.   Conus medullaris and cauda equina: Conus extends to the L1-2 level. Conus and cauda equina appear normal.   Paraspinal and other soft tissues: Negative   Disc levels:   T12-L1: Normal   L1-2: Minimal disc bulge.  No stenosis.   L2-3: Endplate osteophytes and mild bulging of the disc. Mild narrowing of the right lateral recess and intervertebral foramen on the right without likely neural compression.   L3-4: Endplate osteophytes and bulging of the disc. Prominent epidural fat. Mild multifactorial stenosis of the canal and foramina but without definite neural compression.   L4-5: Endplate osteophytes and bulging of the disc. Facet and ligamentous hypertrophy. Prominent epidural fat. Constriction of the thecal sac. Moderate stenosis at this level could be symptomatic. This stenosis actually appeared more severe in 2015.   L5-S1: Endplate osteophytes and mild bulging of the disc. Bilateral facet osteoarthritis. Mild bilateral foraminal narrowing but without definite compression of the exiting  L5 nerves.   IMPRESSION: 1. 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 2. L2-3: Mild right lateral recess and foraminal narrowing but without definite neural compression. 3. L3-4: Mild multifactorial stenosis but without definite neural compression. 4. L4-5: Moderate multifactorial stenosis that could be symptomatic. This stenosis actually appeared worse in 2015. 5. L5-S1: Mild bilateral foraminal narrowing but without definite neural compression. 6. Discogenic endplate marrow changes on the right at L1-2 with associated edema. This could relate to regional back pain.     Electronically Signed   By: Nelson Chimes M.D.   On: 01/12/2022 10:50     Objective:  VS:  HT:    WT:   BMI:     BP:129/73  HR:77bpm  TEMP: ( )  RESP:  Physical Exam Vitals and nursing note reviewed.  Constitutional:      General: He is not in acute distress.    Appearance: Normal appearance. He is not ill-appearing.  HENT:     Head: Normocephalic and atraumatic.     Right Ear: External ear normal.     Left Ear: External ear normal.     Nose: No congestion.  Eyes:     Extraocular Movements: Extraocular movements intact.  Cardiovascular:     Rate and Rhythm: Normal rate.     Pulses: Normal pulses.  Pulmonary:     Effort: Pulmonary effort is normal. No respiratory distress.  Abdominal:     General: There is no distension.     Palpations: Abdomen is soft.  Musculoskeletal:        General: No tenderness or signs of injury.     Cervical back: Neck supple.     Right lower leg: No edema.     Left lower leg: No edema.     Comments: Patient has good distal strength without clonus.  Skin:    Findings: No erythema or rash.  Neurological:     General: No focal deficit present.     Mental Status: He is alert and oriented to person, place, and time.     Sensory: No sensory deficit.     Motor: No weakness or abnormal muscle tone.     Coordination: Coordination normal.  Psychiatric:         Mood and Affect: Mood normal.        Behavior: Behavior normal.      Imaging: No results found.

## 2022-03-21 NOTE — Procedures (Signed)
Lumbosacral Transforaminal Epidural Steroid Injection - Sub-Pedicular Approach with Fluoroscopic Guidance  Patient: Paul Herring      Date of Birth: [birth year] MRN: [id number] PCP: Ginger Organ., MD      Visit Date: 03/10/2022   Universal Protocol:    Date/Time: 03/10/2022  Consent Given By: the patient  Position: PRONE  Additional Comments: Vital signs were monitored before and after the procedure. Patient was prepped and draped in the usual sterile fashion. The correct patient, procedure, and site was verified.   Injection Procedure Details:   Procedure diagnoses: Lumbar radiculopathy [M54.16]    Meds Administered:  Meds ordered this encounter  Medications   methylPREDNISolone acetate (DEPO-MEDROL) injection 80 mg    Laterality: Bilateral  Location/Site: L4  Needle:5.0 in., 22 ga.  Short bevel or Quincke spinal needle  Needle Placement: Transforaminal  Findings:    -Comments: Excellent flow of contrast along the nerve, nerve root and into the epidural space.  Procedure Details: After squaring off the end-plates to get a true AP view, the C-arm was positioned so that an oblique view of the foramen as noted above was visualized. The target area is just inferior to the "nose of the scotty dog" or sub pedicular. The soft tissues overlying this structure were infiltrated with 2-3 ml. of 1% Lidocaine without Epinephrine.  The spinal needle was inserted toward the target using a "trajectory" view along the fluoroscope beam.  Under AP and lateral visualization, the needle was advanced so it did not puncture dura and was located close the 6 O'Clock position of the pedical in AP tracterory. Biplanar projections were used to confirm position. Aspiration was confirmed to be negative for CSF and/or blood. A 1-2 ml. volume of Isovue-250 was injected and flow of contrast was noted at each level. Radiographs were obtained for documentation purposes.   After attaining  the desired flow of contrast documented above, a 0.5 to 1.0 ml test dose of 0.25% Marcaine was injected into each respective transforaminal space.  The patient was observed for 90 seconds post injection.  After no sensory deficits were reported, and normal lower extremity motor function was noted,   the above injectate was administered so that equal amounts of the injectate were placed at each foramen (level) into the transforaminal epidural space.   Additional Comments:  No complications occurred Dressing: 2 x 2 sterile gauze and Band-Aid    Post-procedure details: Patient was observed during the procedure. Post-procedure instructions were reviewed.  Patient left the clinic in stable condition.

## 2022-05-18 DIAGNOSIS — K219 Gastro-esophageal reflux disease without esophagitis: Secondary | ICD-10-CM | POA: Diagnosis not present

## 2022-05-18 DIAGNOSIS — M109 Gout, unspecified: Secondary | ICD-10-CM | POA: Diagnosis not present

## 2022-05-18 DIAGNOSIS — I10 Essential (primary) hypertension: Secondary | ICD-10-CM | POA: Diagnosis not present

## 2022-05-18 DIAGNOSIS — Z125 Encounter for screening for malignant neoplasm of prostate: Secondary | ICD-10-CM | POA: Diagnosis not present

## 2022-05-18 DIAGNOSIS — E785 Hyperlipidemia, unspecified: Secondary | ICD-10-CM | POA: Diagnosis not present

## 2022-05-18 DIAGNOSIS — R7301 Impaired fasting glucose: Secondary | ICD-10-CM | POA: Diagnosis not present

## 2022-05-25 DIAGNOSIS — Z1339 Encounter for screening examination for other mental health and behavioral disorders: Secondary | ICD-10-CM | POA: Diagnosis not present

## 2022-05-25 DIAGNOSIS — I10 Essential (primary) hypertension: Secondary | ICD-10-CM | POA: Diagnosis not present

## 2022-05-25 DIAGNOSIS — K219 Gastro-esophageal reflux disease without esophagitis: Secondary | ICD-10-CM | POA: Diagnosis not present

## 2022-05-25 DIAGNOSIS — R7301 Impaired fasting glucose: Secondary | ICD-10-CM | POA: Diagnosis not present

## 2022-05-25 DIAGNOSIS — I129 Hypertensive chronic kidney disease with stage 1 through stage 4 chronic kidney disease, or unspecified chronic kidney disease: Secondary | ICD-10-CM | POA: Diagnosis not present

## 2022-05-25 DIAGNOSIS — Z1331 Encounter for screening for depression: Secondary | ICD-10-CM | POA: Diagnosis not present

## 2022-05-25 DIAGNOSIS — M549 Dorsalgia, unspecified: Secondary | ICD-10-CM | POA: Diagnosis not present

## 2022-05-25 DIAGNOSIS — R2681 Unsteadiness on feet: Secondary | ICD-10-CM | POA: Diagnosis not present

## 2022-05-25 DIAGNOSIS — E785 Hyperlipidemia, unspecified: Secondary | ICD-10-CM | POA: Diagnosis not present

## 2022-05-25 DIAGNOSIS — N401 Enlarged prostate with lower urinary tract symptoms: Secondary | ICD-10-CM | POA: Diagnosis not present

## 2022-05-25 DIAGNOSIS — Z0000 Encounter for general adult medical examination without abnormal findings: Secondary | ICD-10-CM | POA: Diagnosis not present

## 2022-05-25 DIAGNOSIS — N1831 Chronic kidney disease, stage 3a: Secondary | ICD-10-CM | POA: Diagnosis not present

## 2022-05-25 DIAGNOSIS — R82998 Other abnormal findings in urine: Secondary | ICD-10-CM | POA: Diagnosis not present

## 2022-06-06 DIAGNOSIS — M545 Low back pain, unspecified: Secondary | ICD-10-CM | POA: Diagnosis not present

## 2022-06-07 DIAGNOSIS — Z23 Encounter for immunization: Secondary | ICD-10-CM | POA: Diagnosis not present

## 2022-06-09 DIAGNOSIS — M545 Low back pain, unspecified: Secondary | ICD-10-CM | POA: Diagnosis not present

## 2022-06-14 DIAGNOSIS — M545 Low back pain, unspecified: Secondary | ICD-10-CM | POA: Diagnosis not present

## 2022-06-16 DIAGNOSIS — M545 Low back pain, unspecified: Secondary | ICD-10-CM | POA: Diagnosis not present

## 2022-06-20 DIAGNOSIS — M545 Low back pain, unspecified: Secondary | ICD-10-CM | POA: Diagnosis not present

## 2022-06-22 DIAGNOSIS — M545 Low back pain, unspecified: Secondary | ICD-10-CM | POA: Diagnosis not present

## 2022-06-27 DIAGNOSIS — M545 Low back pain, unspecified: Secondary | ICD-10-CM | POA: Diagnosis not present

## 2022-06-29 DIAGNOSIS — M545 Low back pain, unspecified: Secondary | ICD-10-CM | POA: Diagnosis not present

## 2022-07-06 DIAGNOSIS — M545 Low back pain, unspecified: Secondary | ICD-10-CM | POA: Diagnosis not present

## 2022-07-11 DIAGNOSIS — M545 Low back pain, unspecified: Secondary | ICD-10-CM | POA: Diagnosis not present

## 2022-07-13 DIAGNOSIS — M545 Low back pain, unspecified: Secondary | ICD-10-CM | POA: Diagnosis not present

## 2022-07-18 DIAGNOSIS — M545 Low back pain, unspecified: Secondary | ICD-10-CM | POA: Diagnosis not present

## 2022-07-21 DIAGNOSIS — M545 Low back pain, unspecified: Secondary | ICD-10-CM | POA: Diagnosis not present

## 2022-07-26 DIAGNOSIS — M545 Low back pain, unspecified: Secondary | ICD-10-CM | POA: Diagnosis not present

## 2022-07-28 DIAGNOSIS — M545 Low back pain, unspecified: Secondary | ICD-10-CM | POA: Diagnosis not present

## 2022-08-02 DIAGNOSIS — M545 Low back pain, unspecified: Secondary | ICD-10-CM | POA: Diagnosis not present

## 2022-08-04 DIAGNOSIS — M545 Low back pain, unspecified: Secondary | ICD-10-CM | POA: Diagnosis not present

## 2022-08-08 DIAGNOSIS — M545 Low back pain, unspecified: Secondary | ICD-10-CM | POA: Diagnosis not present

## 2022-08-10 DIAGNOSIS — M545 Low back pain, unspecified: Secondary | ICD-10-CM | POA: Diagnosis not present

## 2022-08-15 DIAGNOSIS — M545 Low back pain, unspecified: Secondary | ICD-10-CM | POA: Diagnosis not present

## 2022-08-17 DIAGNOSIS — M545 Low back pain, unspecified: Secondary | ICD-10-CM | POA: Diagnosis not present

## 2022-08-22 ENCOUNTER — Encounter (INDEPENDENT_AMBULATORY_CARE_PROVIDER_SITE_OTHER): Payer: Medicare Other | Admitting: Ophthalmology

## 2022-08-22 DIAGNOSIS — H353221 Exudative age-related macular degeneration, left eye, with active choroidal neovascularization: Secondary | ICD-10-CM | POA: Diagnosis not present

## 2022-08-22 DIAGNOSIS — H353112 Nonexudative age-related macular degeneration, right eye, intermediate dry stage: Secondary | ICD-10-CM | POA: Diagnosis not present

## 2022-08-22 DIAGNOSIS — I10 Essential (primary) hypertension: Secondary | ICD-10-CM

## 2022-08-22 DIAGNOSIS — H43813 Vitreous degeneration, bilateral: Secondary | ICD-10-CM

## 2022-08-22 DIAGNOSIS — H35033 Hypertensive retinopathy, bilateral: Secondary | ICD-10-CM

## 2022-09-15 DIAGNOSIS — Z23 Encounter for immunization: Secondary | ICD-10-CM | POA: Diagnosis not present

## 2022-09-19 ENCOUNTER — Encounter (INDEPENDENT_AMBULATORY_CARE_PROVIDER_SITE_OTHER): Payer: Medicare Other | Admitting: Ophthalmology

## 2022-09-19 DIAGNOSIS — H353112 Nonexudative age-related macular degeneration, right eye, intermediate dry stage: Secondary | ICD-10-CM

## 2022-09-19 DIAGNOSIS — H43813 Vitreous degeneration, bilateral: Secondary | ICD-10-CM | POA: Diagnosis not present

## 2022-09-19 DIAGNOSIS — I10 Essential (primary) hypertension: Secondary | ICD-10-CM

## 2022-09-19 DIAGNOSIS — H353221 Exudative age-related macular degeneration, left eye, with active choroidal neovascularization: Secondary | ICD-10-CM | POA: Diagnosis not present

## 2022-09-19 DIAGNOSIS — H35033 Hypertensive retinopathy, bilateral: Secondary | ICD-10-CM

## 2022-10-04 DIAGNOSIS — Z23 Encounter for immunization: Secondary | ICD-10-CM | POA: Diagnosis not present

## 2022-10-17 ENCOUNTER — Encounter (INDEPENDENT_AMBULATORY_CARE_PROVIDER_SITE_OTHER): Payer: Medicare Other | Admitting: Ophthalmology

## 2022-10-17 DIAGNOSIS — H35033 Hypertensive retinopathy, bilateral: Secondary | ICD-10-CM | POA: Diagnosis not present

## 2022-10-17 DIAGNOSIS — I10 Essential (primary) hypertension: Secondary | ICD-10-CM

## 2022-10-17 DIAGNOSIS — H353112 Nonexudative age-related macular degeneration, right eye, intermediate dry stage: Secondary | ICD-10-CM | POA: Diagnosis not present

## 2022-10-17 DIAGNOSIS — H43813 Vitreous degeneration, bilateral: Secondary | ICD-10-CM | POA: Diagnosis not present

## 2022-10-17 DIAGNOSIS — H353221 Exudative age-related macular degeneration, left eye, with active choroidal neovascularization: Secondary | ICD-10-CM

## 2022-11-14 ENCOUNTER — Encounter (INDEPENDENT_AMBULATORY_CARE_PROVIDER_SITE_OTHER): Payer: Medicare Other | Admitting: Ophthalmology

## 2022-11-14 DIAGNOSIS — H43813 Vitreous degeneration, bilateral: Secondary | ICD-10-CM

## 2022-11-14 DIAGNOSIS — I10 Essential (primary) hypertension: Secondary | ICD-10-CM

## 2022-11-14 DIAGNOSIS — H353221 Exudative age-related macular degeneration, left eye, with active choroidal neovascularization: Secondary | ICD-10-CM | POA: Diagnosis not present

## 2022-11-14 DIAGNOSIS — H353112 Nonexudative age-related macular degeneration, right eye, intermediate dry stage: Secondary | ICD-10-CM

## 2022-11-14 DIAGNOSIS — H35033 Hypertensive retinopathy, bilateral: Secondary | ICD-10-CM

## 2022-11-16 ENCOUNTER — Encounter (INDEPENDENT_AMBULATORY_CARE_PROVIDER_SITE_OTHER): Payer: Self-pay

## 2022-11-16 ENCOUNTER — Ambulatory Visit (INDEPENDENT_AMBULATORY_CARE_PROVIDER_SITE_OTHER): Payer: Medicare Other | Admitting: Audiology

## 2022-11-16 ENCOUNTER — Ambulatory Visit (INDEPENDENT_AMBULATORY_CARE_PROVIDER_SITE_OTHER): Payer: Medicare Other | Admitting: Otolaryngology

## 2022-11-16 VITALS — Ht 69.0 in | Wt 190.0 lb

## 2022-11-16 DIAGNOSIS — R42 Dizziness and giddiness: Secondary | ICD-10-CM | POA: Diagnosis not present

## 2022-11-16 DIAGNOSIS — H903 Sensorineural hearing loss, bilateral: Secondary | ICD-10-CM

## 2022-11-16 NOTE — Progress Notes (Signed)
  [address] [place], [place] [address] [phone]  Audiological Evaluation    Name: Paul Herring     DOB:   10-10-1940      MRN:   [id number]                                                                                     Service Date: 11/16/2022        Patient was referred today for a hearing evaluation by Dr. Karle Barr.   Symptoms Yes Details  Hearing loss  [x]  Patient reported perceiving hearing loss in both ears.  Tinnitus  []  Patient denied experiencing tinnitus.  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  Previous ear surgeries  []  Patient denied any previous ear surgeries.  Amplification  []  Patient denied the use of hearing aids.    Tympanogram: Right ear: Normal external ear canal volume with normal middle ear pressure and tympanic membrane compliance (Type A). Left ear: Normal external ear canal volume with no middle ear pressure and tympanic membrane compliance (Type B).    Hearing Evaluation: The audiogram was completed using conventional audiometric techniques under headphones with good-fair reliability.   The hearing test results indicate: Right ear: Normal hearing sensitivity at 125 Hz gradually sloping to moderate sensorineural hearing loss from [phone] Hz. Left ear: Normal hearing sensitivity from 125-250 Hz gradually sloping to moderately-severe sensorineural hearing loss from [phone] Hz.  Speech Audiometry: Right ear- Speech Reception Threshold (SRT) was obtained at 40 dBHL. Left ear- Speech Reception Threshold (SRT) was obtained at 35 dBHL.   Word Recognition Score Tested using NU-6 (MLV) Right ear: 80% was obtained at a presentation level of 80 dBHL which is deemed as fair understanding. Left ear: 96% was obtained at a presentation level of 75 dBHL which is deemed as excellent understanding.    Impression:  There is not a significant difference between puretone  thresholds between ears.   Recommendations: Repeat audiogram when changes are perceived or per MD. Patient is a candidate for hearing amplification, consider a hearing aid evaluation.   Conley Rolls Leslee Haueter, AUD, CCC-A 11/16/22

## 2022-11-18 NOTE — Progress Notes (Signed)
Patient ID: Paul Herring, male   DOB: Dec 07, 1940, 82 y.o.   MRN: [id number]  CC: Chronically off balance, dizziness  HPI:  Paul Herring is an 82 y.o. male who presents today complaining of chronic difficulty with his balance and dizziness for the past 6 months.  He describes his dizziness as an off-balance and lightheaded sensation.  He has significant difficulty judging his steps and distance.  He denies any spinning vertigo.  He has a history of hearing difficulty, secondary to loud noise exposure.  He used to work in a Environmental education officer.  He also served in [employer].  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  He also has a history of lumbar radiculopathy.  He has no previous ENT surgery.  He denies any recent otitis media or otitis externa.  He was previously treated at [facility] physical therapy.  Past Medical History:  Diagnosis Date   Arthritis    Cataract    Chronic kidney disease    Enlarged prostate    GERD (gastroesophageal reflux disease)    Hyperlipidemia    Hypertension     Past Surgical History:  Procedure Laterality Date   ANGIOGRAM/LV (CONGENITAL)     Bladder Stent     COLONOSCOPY     POLYPECTOMY     TONSILLECTOMY      Family History  Problem Relation Age of Onset   Colon cancer Neg Hx    Esophageal cancer Neg Hx    Rectal cancer Neg Hx    Stomach cancer Neg Hx     Social History:  reports that he quit smoking about 46 years ago. His smoking use included cigarettes. He has never used smokeless tobacco. He reports current alcohol use. He reports that he does not use drugs.  Allergies:  Allergies  Allergen Reactions   Pentazocine Lactate Palpitations    Prior to Admission medications   Medication Sig Start Date End Date Taking? Authorizing Provider  amLODipine (NORVASC) 10 MG tablet TK 1 T PO QD 09/08/15  Yes [provider]  Ascorbic Acid (VITAMIN C) 1000 MG tablet Take 1,000 mg by  mouth daily.   Yes [provider]  atorvastatin (LIPITOR) 20 MG tablet TK 1 T PO HS 09/08/15  Yes [provider]  B Complex-C (SUPER B COMPLEX PO) Take by mouth.   Yes [provider]  baclofen (LIORESAL) 10 MG tablet Take 10 mg by mouth every 8 (eight) hours as needed. 02/21/22  Yes [provider]  betamethasone dipropionate (DIPROLENE) 0.05 % cream Apply topically 2 (two) times daily.   Yes [provider]  co-enzyme Q-10 30 MG capsule Take 30 mg by mouth daily.   Yes [provider]  finasteride (PROSCAR) 5 MG tablet TK 1 T PO D UTD 09/07/15  Yes [provider]  Glucosamine-Chondroitin 500-250 MG CAPS Take 1 tablet by mouth 3 (three) times daily.   Yes [provider]  losartan (COZAAR) 100 MG tablet TK 1 T PO D 09/07/15  Yes [provider]  Multiple Vitamins-Minerals (OCUVITE ADULT 50+ PO) Take by mouth.   Yes [provider]  multivitamin-lutein (OCUVITE-LUTEIN) CAPS capsule Take 1 capsule by mouth daily.   Yes [provider]  Omega-3 Fatty Acids (FISH OIL) 1000 MG CAPS Take by mouth.   Yes [provider]  omeprazole (PRILOSEC) 20 MG capsule TK 1 C PO QD 10/10/15  Yes [provider]  tamsulosin (FLOMAX) 0.4 MG CAPS capsule TK 1 C PO BID 09/07/15  Yes [provider]  vitamin E 400 UNIT capsule Take 400 Units by mouth daily.   Yes [provider]  diazepam (VALIUM) 5 MG tablet Take one tablet by mouth with food one hour prior to procedure. May repeat 30 minutes prior if needed. Patient not taking: Reported on 11/16/2022 09/22/21   Juanda Chance, NP   Height 5\' 9"  (1.753 m), weight 190 lb (86.2 kg). Exam: General: Communicates without difficulty, well nourished, no acute distress. Head: Normocephalic, no evidence injury, no tenderness, facial buttresses intact without stepoff. Face/sinus: No tenderness to palpation and percussion. Facial movement is normal and  symmetric. Eyes: PERRL, EOMI. No scleral icterus, conjunctivae clear. Neuro: CN II exam reveals vision grossly intact.  No nystagmus at any point of gaze. Ears: Auricles well formed without lesions.  Ear canals are intact without mass or lesion.  No erythema or edema is appreciated.  The TMs are intact without fluid. Nose: External evaluation reveals normal support and skin without lesions.  Dorsum is intact.  Anterior rhinoscopy reveals congested mucosa over anterior aspect of inferior turbinates and intact septum.  No purulence noted. Oral:  Oral cavity and oropharynx are intact, symmetric, without erythema or edema.  Mucosa is moist without lesions. Neck: Full range of motion without pain.  There is no significant lymphadenopathy.  No masses palpable.  Thyroid bed within normal limits to palpation.  Parotid glands and submandibular glands equal bilaterally without mass.  Trachea is midline. Neuro:  CN 2-12 grossly intact.  Vestibular: No nystagmus at any point of gaze. Dix Hallpike negative. Vestibular: There is no nystagmus with pneumatic pressure on either tympanic membrane or Valsalva. The cerebellar examination is unremarkable.    His hearing test shows bilateral symmetric high-frequency sensorineural hearing loss, likely secondary to presbycusis and his previous loud noise exposure.  Assessment: 1.  Chronic balance difficulty and dizziness.  The etiology is likely multifactorial, secondary to his visual loss and neuropathy.  Other possible differential diagnoses include vestibular migraine, Mnire's disease, peripheral vestibular dysfunction, transient BPPV, or other central/systemic causes. 2.  His ear canals, tympanic membranes, and middle ear spaces are normal.  No middle ear effusion or infection is noted. 3.  Bilateral symmetric high-frequency sensorineural hearing loss, likely secondary to routine presbycusis and previous loud noise exposure.  Plan: 1.  The physical exam findings and the  hearing test results are reviewed with the patient. 2.  The pathophysiology of balance difficulty and dizziness are discussed extensively with the patient. The possible differential diagnoses are reviewed. Questions are invited and answered.   3.  Vestibular neurodiagnostic testing to evaluate for possible vestibular dysfunction. 4.  The patient will return for reevaluation after his vestibular testing. 5.  Continue his balance exercise at home.  Paul Herring W Bentzion Dauria 11/18/2022, 3:49 PM

## 2022-11-29 DIAGNOSIS — M545 Low back pain, unspecified: Secondary | ICD-10-CM | POA: Diagnosis not present

## 2022-12-06 DIAGNOSIS — M545 Low back pain, unspecified: Secondary | ICD-10-CM | POA: Diagnosis not present

## 2022-12-08 DIAGNOSIS — M545 Low back pain, unspecified: Secondary | ICD-10-CM | POA: Diagnosis not present

## 2022-12-12 ENCOUNTER — Encounter (INDEPENDENT_AMBULATORY_CARE_PROVIDER_SITE_OTHER): Payer: Medicare Other | Admitting: Ophthalmology

## 2022-12-12 DIAGNOSIS — I10 Essential (primary) hypertension: Secondary | ICD-10-CM | POA: Diagnosis not present

## 2022-12-12 DIAGNOSIS — H353112 Nonexudative age-related macular degeneration, right eye, intermediate dry stage: Secondary | ICD-10-CM

## 2022-12-12 DIAGNOSIS — H35033 Hypertensive retinopathy, bilateral: Secondary | ICD-10-CM

## 2022-12-12 DIAGNOSIS — H43813 Vitreous degeneration, bilateral: Secondary | ICD-10-CM | POA: Diagnosis not present

## 2022-12-12 DIAGNOSIS — H353221 Exudative age-related macular degeneration, left eye, with active choroidal neovascularization: Secondary | ICD-10-CM | POA: Diagnosis not present

## 2022-12-14 DIAGNOSIS — M545 Low back pain, unspecified: Secondary | ICD-10-CM | POA: Diagnosis not present

## 2022-12-19 DIAGNOSIS — M545 Low back pain, unspecified: Secondary | ICD-10-CM | POA: Diagnosis not present

## 2022-12-20 ENCOUNTER — Ambulatory Visit (INDEPENDENT_AMBULATORY_CARE_PROVIDER_SITE_OTHER): Payer: Medicare Other | Admitting: Audiology

## 2022-12-20 DIAGNOSIS — R42 Dizziness and giddiness: Secondary | ICD-10-CM | POA: Diagnosis not present

## 2022-12-20 NOTE — Progress Notes (Signed)
[address], [address] [place], [place] [address] [phone]  Vestibular Evaluation    Name: Paul Herring     DOB:   1940-03-24      MRN:   [id number]                                                                                     Service Date: 12/20/2022     Accompanied by: unaccompanied     Patient comes today after Dr. Suszanne Conners, ENT sent a referral for a vestibular evaluation due to concerns with balance.   Case History:  Patient reported a sensation of imbalance that started approximately  7 months ago. Patient noted that nothing unusual happens when they roll over in bed. They described their imbalance as a daily off balance sensation when standing up or walking. Some days are better than others. He thinks that uneven surfaces, stepping up or going down curbs makes it worse. He reports that he uses gabapentin to treat his symptoms and tylenol for his back. Patient felt better while he was on physical therapy which he had to pause.   Today the patient reported taking Gabapentin within the past 48 hours, which may affect test results. 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   Patient's hearing was tested on 11-16-2022 and results indicated:  Right ear: Normal hearing sensitivity at 125 Hz gradually sloping to moderate sensorineural hearing loss from [phone]. Left ear: Normal hearing sensitivity from 125-250 Hz gradually sloping to moderately-severe sensorineural hearing loss from [phone] Hz.Connye Burkitt Sensory Organization Performance (SOP) Test: Romberg (Conditions 1-2): WNL Tandem Romberg (Conditions 3-4): sway CTSIB (Conditions 5-6): sway Fukuda (Condition 7): Patient said he could not close his eyes while marching.    Vetebral Artery Screening Test  (VAST): VAST Right: Negative VAST Left: Negative    Electrophysiology: Auditory Brainstem Response (ABR): WNL in the left ear and absent absolute waves I and III in the right ear. Electrocochleography (ECochG): Inconclusive in both ears. Cervical Vestibular-Evoked Myogenic Potential (cVEMP): Absent in the right and reduced in the left.   Videonystagmography (VNG): Ocular Motility Tests Gaze: WNL Smooth Pursuit: WNL Saccades: WNL OPK: WNL  Spontaneous Nystagmus Sitting: None Supine: subclinical 2 degrees/second left beating nystagmus Head Shake (Active): None  Positional Tests Head Right: subclinical 2 degrees/second left beating nystagmus that did not fixate with visual target Head Left: WNL Body Right: DNT Body Left: DNT  Positioning Tests Head Hanging Right: subclinical 4 degrees/second left beating nystagmus Head Hanging Left: subclinical 3 degrees/second left beating nystagmus Dix-Hallpike/Side Lying Right: Negative for BPPV Dix-Hallpike/Side Lying Left: Negative for BPPV  Bithermal Air Calorics Unilateral Weakness: 11% (<25% WNL), right Directional Preponderance: 8% (<30% WNL), left Bilateral Weakness: 65 deg/s (>24 deg/s WNL) Fixation Index: Normal  Rotary Chair: Sinusoidal Harmonic Acceleration (SHA) Frequencies Tested: 0.01, 0.02, 0.04, 0.08, 0.16, 0.64 Hz Gain: WNL Phase: Phase lead at 0.08, 0.16,and 0.64 Hz Symmetry: WNL  Step Velocity Test (SVT) Gain: WNL Time Constant: WNL Time Constant Asymmetry: WNL  Conclusions:   Abnormal vestibular assessment with central indicators.  1. Abnormal Gans SOP Test results suggest an imbalance between visual, somatosensory, and vestibular inputs for dynamic postural control. 2. Vertebral Artery Screening Test was negative, bilaterally, for vertebral artery insufficiency.  3. Normal ABR results in the left ear suggest good function of the cochlear nerve.  Abnormal ABR results in the right ear may be attributed  to high-frequency sensorineural hearing loss, poor wave morphology and replicability, and/or possible retrocochlear site of lesion. 4.Inconclusive ECochG results in both ears may be attributed to poor wave morphology and replicability. Elevated labyrinthine pressure, or endolymphatic hydrops, cannot be ruled out in either ear at this time. 5. Abnormal cVEMP results in both ears may be attributed to poor function of the saccule and/or inferior vestibular nerve, a normal age-related variant associated with low muscle tone at the recording site, and/or history of neck issues.  6. Normal ocular motility test results suggest normal vestibulo-ocular reflex. No clinically significant nystagmus or BPPV observed during positional and positioning tests. Normal head shake test results do not suggest an imbalance of vestibular inputs. Normal bithermal air calorics results do not suggest a unilateral weakness or bilateral weakness affecting the lateral semicircular canal and/or superior vestibular nerve in either ear.  7. Abnormal phase lead for [facility] suggests central involvement. Normal SVT results suggest an intact velocity storage mechanism.       Recommendations: 1- Consider imaging to rule out possible retrocochlear site of lesion. 2- Referral to physical therapy for vestibular rehabilitation. 3- Follow-up appointment with ENT as scheduled at [facility] for further discussion and recommendations.  Audiologist: Merrily Pew, AUD  Date of Service: 12/20/22

## 2022-12-21 DIAGNOSIS — M545 Low back pain, unspecified: Secondary | ICD-10-CM | POA: Diagnosis not present

## 2022-12-23 DIAGNOSIS — M545 Low back pain, unspecified: Secondary | ICD-10-CM | POA: Diagnosis not present

## 2022-12-26 ENCOUNTER — Ambulatory Visit (INDEPENDENT_AMBULATORY_CARE_PROVIDER_SITE_OTHER): Payer: Medicare Other

## 2022-12-26 ENCOUNTER — Encounter (INDEPENDENT_AMBULATORY_CARE_PROVIDER_SITE_OTHER): Payer: Self-pay

## 2022-12-26 VITALS — Ht 68.0 in | Wt 190.0 lb

## 2022-12-26 DIAGNOSIS — H903 Sensorineural hearing loss, bilateral: Secondary | ICD-10-CM | POA: Diagnosis not present

## 2022-12-26 DIAGNOSIS — R42 Dizziness and giddiness: Secondary | ICD-10-CM

## 2022-12-27 ENCOUNTER — Encounter: Payer: Self-pay | Admitting: Audiology

## 2022-12-27 NOTE — Progress Notes (Signed)
Patient ID: Paul Herring, male   DOB: 23-Jun-1940, 82 y.o.   MRN: [id number]  Follow-up: Chronic imbalance, dizziness  HPI: The patient is an 82 year old male who returns today for his follow-up evaluation.  He was previously seen for his chronic balance difficulty and dizziness.  He has been having significant difficulty judging his steps and distance.  He has no spinning vertigo. 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 He also has a history of lumbar radiculopathy.  At his last visit, he was also noted to have bilateral symmetric high-frequency sensorineural hearing loss, likely secondary to routine presbycusis.  He subsequently underwent vestibular neurodiagnostic testing.  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  The patient returns today complaining of persistent difficulty with his balance.  He denies any otalgia, otorrhea, vertigo, or change in his hearing.  Exam: General: Communicates without difficulty, well nourished, no acute distress. Head: Normocephalic, no evidence injury, no tenderness, facial buttresses intact without stepoff. Face/sinus: No tenderness to palpation and percussion. Facial movement is normal and symmetric. Eyes: PERRL, EOMI. No scleral icterus, conjunctivae clear. Neuro: CN II exam reveals vision grossly intact.  No nystagmus at any point of gaze. Ears: Auricles well formed without lesions.  Ear canals are intact without mass or lesion.  No erythema or edema is appreciated.  The TMs are intact without fluid. Nose: External evaluation reveals normal support and skin without lesions.  Dorsum is intact.  Anterior rhinoscopy reveals congested mucosa over anterior aspect of inferior turbinates and intact septum.  No purulence noted. Oral:  Oral cavity and oropharynx are intact, symmetric, without erythema or edema.  Mucosa is moist without lesions. Neck: Full range of motion without pain.  There is  no significant lymphadenopathy.  No masses palpable.  Thyroid bed within normal limits to palpation.  Parotid glands and submandibular glands equal bilaterally without mass.  Trachea is midline. Neuro:  CN 2-12 grossly intact.  Vestibular: No nystagmus at any point of gaze. Dix Hallpike negative. Vestibular: There is no nystagmus with pneumatic pressure on either tympanic membrane or Valsalva. The cerebellar examination is unremarkable.    Assessment: 1.  Chronic balance difficulty and dizziness.  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  In addition, he has multiple risk factors, including his visual loss and neuropathy. 2.  His ear canals, tympanic membranes, and middle ear spaces are normal. 3.  Subjectively stable bilateral high-frequency sensorineural hearing loss.  Plan: 1.  The physical exam findings and the vestibular neurodiagnostic testing results are reviewed with the patient. 2.  The pathophysiology of vestibular dysfunction and balance difficulty are discussed extensively with the patient.  Questions are invited and answered. 3.  The patient will likely benefit from undergoing physical therapy/vestibular rehabilitation to improve the balancing function. A referral will be arranged as soon as possible.  4.  The patient is encouraged to call with any questions or concerns.

## 2022-12-29 DIAGNOSIS — M545 Low back pain, unspecified: Secondary | ICD-10-CM | POA: Diagnosis not present

## 2023-01-02 DIAGNOSIS — M545 Low back pain, unspecified: Secondary | ICD-10-CM | POA: Diagnosis not present

## 2023-01-05 DIAGNOSIS — M545 Low back pain, unspecified: Secondary | ICD-10-CM | POA: Diagnosis not present

## 2023-01-09 DIAGNOSIS — M545 Low back pain, unspecified: Secondary | ICD-10-CM | POA: Diagnosis not present

## 2023-01-11 DIAGNOSIS — M545 Low back pain, unspecified: Secondary | ICD-10-CM | POA: Diagnosis not present

## 2023-01-13 DIAGNOSIS — M545 Low back pain, unspecified: Secondary | ICD-10-CM | POA: Diagnosis not present

## 2023-01-16 ENCOUNTER — Encounter (INDEPENDENT_AMBULATORY_CARE_PROVIDER_SITE_OTHER): Payer: Medicare Other | Admitting: Ophthalmology

## 2023-01-16 DIAGNOSIS — I10 Essential (primary) hypertension: Secondary | ICD-10-CM

## 2023-01-16 DIAGNOSIS — H353221 Exudative age-related macular degeneration, left eye, with active choroidal neovascularization: Secondary | ICD-10-CM | POA: Diagnosis not present

## 2023-01-16 DIAGNOSIS — H43813 Vitreous degeneration, bilateral: Secondary | ICD-10-CM | POA: Diagnosis not present

## 2023-01-16 DIAGNOSIS — H353112 Nonexudative age-related macular degeneration, right eye, intermediate dry stage: Secondary | ICD-10-CM

## 2023-01-16 DIAGNOSIS — H35033 Hypertensive retinopathy, bilateral: Secondary | ICD-10-CM | POA: Diagnosis not present

## 2023-01-18 DIAGNOSIS — M545 Low back pain, unspecified: Secondary | ICD-10-CM | POA: Diagnosis not present

## 2023-01-20 DIAGNOSIS — M545 Low back pain, unspecified: Secondary | ICD-10-CM | POA: Diagnosis not present

## 2023-01-23 DIAGNOSIS — M545 Low back pain, unspecified: Secondary | ICD-10-CM | POA: Diagnosis not present

## 2023-01-25 DIAGNOSIS — M545 Low back pain, unspecified: Secondary | ICD-10-CM | POA: Diagnosis not present

## 2023-01-27 DIAGNOSIS — M545 Low back pain, unspecified: Secondary | ICD-10-CM | POA: Diagnosis not present

## 2023-01-30 DIAGNOSIS — M545 Low back pain, unspecified: Secondary | ICD-10-CM | POA: Diagnosis not present

## 2023-02-01 DIAGNOSIS — M545 Low back pain, unspecified: Secondary | ICD-10-CM | POA: Diagnosis not present

## 2023-02-03 DIAGNOSIS — M545 Low back pain, unspecified: Secondary | ICD-10-CM | POA: Diagnosis not present

## 2023-02-06 DIAGNOSIS — M545 Low back pain, unspecified: Secondary | ICD-10-CM | POA: Diagnosis not present

## 2023-02-08 DIAGNOSIS — M545 Low back pain, unspecified: Secondary | ICD-10-CM | POA: Diagnosis not present

## 2023-02-10 DIAGNOSIS — M545 Low back pain, unspecified: Secondary | ICD-10-CM | POA: Diagnosis not present

## 2023-02-15 DIAGNOSIS — M545 Low back pain, unspecified: Secondary | ICD-10-CM | POA: Diagnosis not present

## 2023-02-20 ENCOUNTER — Encounter (INDEPENDENT_AMBULATORY_CARE_PROVIDER_SITE_OTHER): Payer: Medicare Other | Admitting: Ophthalmology

## 2023-02-20 DIAGNOSIS — H35033 Hypertensive retinopathy, bilateral: Secondary | ICD-10-CM

## 2023-02-20 DIAGNOSIS — I10 Essential (primary) hypertension: Secondary | ICD-10-CM | POA: Diagnosis not present

## 2023-02-20 DIAGNOSIS — H353221 Exudative age-related macular degeneration, left eye, with active choroidal neovascularization: Secondary | ICD-10-CM | POA: Diagnosis not present

## 2023-02-20 DIAGNOSIS — H353112 Nonexudative age-related macular degeneration, right eye, intermediate dry stage: Secondary | ICD-10-CM

## 2023-02-20 DIAGNOSIS — H43813 Vitreous degeneration, bilateral: Secondary | ICD-10-CM | POA: Diagnosis not present

## 2023-02-27 DIAGNOSIS — M545 Low back pain, unspecified: Secondary | ICD-10-CM | POA: Diagnosis not present

## 2023-03-01 DIAGNOSIS — M545 Low back pain, unspecified: Secondary | ICD-10-CM | POA: Diagnosis not present

## 2023-03-03 DIAGNOSIS — M545 Low back pain, unspecified: Secondary | ICD-10-CM | POA: Diagnosis not present

## 2023-03-07 DIAGNOSIS — M5416 Radiculopathy, lumbar region: Secondary | ICD-10-CM | POA: Diagnosis not present

## 2023-03-07 DIAGNOSIS — I10 Essential (primary) hypertension: Secondary | ICD-10-CM | POA: Diagnosis not present

## 2023-03-07 DIAGNOSIS — R7301 Impaired fasting glucose: Secondary | ICD-10-CM | POA: Diagnosis not present

## 2023-03-07 DIAGNOSIS — M545 Low back pain, unspecified: Secondary | ICD-10-CM | POA: Diagnosis not present

## 2023-03-07 DIAGNOSIS — M109 Gout, unspecified: Secondary | ICD-10-CM | POA: Diagnosis not present

## 2023-03-07 DIAGNOSIS — E785 Hyperlipidemia, unspecified: Secondary | ICD-10-CM | POA: Diagnosis not present

## 2023-03-07 DIAGNOSIS — R202 Paresthesia of skin: Secondary | ICD-10-CM | POA: Diagnosis not present

## 2023-03-09 DIAGNOSIS — M545 Low back pain, unspecified: Secondary | ICD-10-CM | POA: Diagnosis not present

## 2023-03-13 ENCOUNTER — Ambulatory Visit: Admitting: Physical Medicine and Rehabilitation

## 2023-03-13 ENCOUNTER — Encounter: Payer: Self-pay | Admitting: Physical Medicine and Rehabilitation

## 2023-03-13 VITALS — BP 124/76 | HR 73

## 2023-03-13 DIAGNOSIS — M5442 Lumbago with sciatica, left side: Secondary | ICD-10-CM

## 2023-03-13 DIAGNOSIS — M48062 Spinal stenosis, lumbar region with neurogenic claudication: Secondary | ICD-10-CM

## 2023-03-13 DIAGNOSIS — M545 Low back pain, unspecified: Secondary | ICD-10-CM | POA: Diagnosis not present

## 2023-03-13 DIAGNOSIS — G8929 Other chronic pain: Secondary | ICD-10-CM | POA: Diagnosis not present

## 2023-03-13 DIAGNOSIS — M5441 Lumbago with sciatica, right side: Secondary | ICD-10-CM

## 2023-03-13 DIAGNOSIS — M5416 Radiculopathy, lumbar region: Secondary | ICD-10-CM

## 2023-03-13 NOTE — Progress Notes (Unsigned)
 Paul Herring - 83 y.o. male MRN [id number]  Date of birth: 11/10/1940  Office Visit Note: Visit Date: 03/13/2023 PCP: Cleatis Polka., MD Referred by: Cleatis Polka., MD  Subjective: Chief Complaint  Patient presents with   Lower Back - Pain   HPI: Paul Herring is a 83 y.o. male who comes in today Chronic, worsening and severe bilateral lower back pain radiating down legs to feet. Patient is well known to [place]. Pain ongoing for several years. His pain becomes severe at night. Also reports severe pain with standing and walking. He describes his pain as a sore and aching sensation, currently rates as 8 out of 10. Some relief of home exercise regimen, rest and medications. He is taking Gabapentin TID. He is attending formal physical therapy for more strengthening and balance issues. She is attending therapy at [facility]. Lumbar MRI imaging from 2024 shows moderate multifactorial stenosis at L4-L5 that could be symptomatic, stenosis actually appeared worse in 2015. He reports intermittent relief of pain with lumbar epidural steroid injections in our office over the years. Most recent injection was bilateral L4 transforaminal epidural steroid injection on 03/10/2022. He is unable to recall exact relief of pain with this injection. He is currently using cane to assist with ambulation. Patient denies focal weakness. No recent trauma or falls.       Review of Systems  Musculoskeletal:  Positive for back pain.  Neurological:  Positive for tingling. Negative for sensory change, focal weakness and weakness.  All other systems reviewed and are negative.  Otherwise per HPI.  Assessment & Plan: Visit Diagnoses:    ICD-10-CM   1. Chronic bilateral low back pain with bilateral sciatica  M54.42    M54.41    G89.29     2. Lumbar radiculopathy  M54.16     3. Spinal stenosis of lumbar region with neurogenic claudication  M48.062        Plan: Findings:  Chronic,  worsening and severe bilateral lower back pain radiating down legs to feet. Patient continues to have severe pain despite good conservative therapies such as formal physical therapy, home exercise regimen, rest and use of medications. Patients clinical presentation and exam are consistent with neurogenic claudication as a result of spinal canal stenosis. There is moderate spinal canal stenosis noted at L4-L5. We discussed treatment plan in detail today, next step is to perform diagnostic and hopefully therapeutic bilateral L4 transforaminal epidural steroid injection under fluoroscopic guidance. If good relief of pain with this injection we can repeat this procedure infrequently as needed. Patient has no questions regarding injection procedure. I instructed him to continue with formal physical therapy as tolerated. He can also continue with current medication regimen. No red flag symptoms noted upon exam today.     Meds & Orders: No orders of the defined types were placed in this encounter.  No orders of the defined types were placed in this encounter.   Follow-up: Return for Bilateral L4 transforaminal epidural steroid injection.   Procedures: No procedures performed      Clinical History: MRI LUMBAR SPINE WITHOUT CONTRAST   TECHNIQUE: Multiplanar, multisequence MR imaging of the lumbar spine was performed. No intravenous contrast was administered.   COMPARISON:  05/29/2013   FINDINGS: Segmentation:  5 lumbar type vertebral bodies.   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 No antero or retrolisthesis.   Vertebrae: Edematous degenerative endplate changes on the right at L1-2. Chronic  endplate marrow changes at the other levels without edema. Benign appearing hemangioma or lipoma within the left posterior vertebral body at L2.   Conus medullaris and cauda equina: Conus extends to the L1-2 level. Conus and cauda equina appear normal.   Paraspinal and  other soft tissues: Negative   Disc levels:   T12-L1: Normal   L1-2: Minimal disc bulge.  No stenosis.   L2-3: Endplate osteophytes and mild bulging of the disc. Mild narrowing of the right lateral recess and intervertebral foramen on the right without likely neural compression.   L3-4: Endplate osteophytes and bulging of the disc. Prominent epidural fat. Mild multifactorial stenosis of the canal and foramina but without definite neural compression.   L4-5: Endplate osteophytes and bulging of the disc. Facet and ligamentous hypertrophy. Prominent epidural fat. Constriction of the thecal sac. Moderate stenosis at this level could be symptomatic. This stenosis actually appeared more severe in 2015.   L5-S1: Endplate osteophytes and mild bulging of the disc. Bilateral facet osteoarthritis. Mild bilateral foraminal narrowing but without definite compression of the exiting L5 nerves.   IMPRESSION: 1. 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 2. L2-3: Mild right lateral recess and foraminal narrowing but without definite neural compression. 3. L3-4: Mild multifactorial stenosis but without definite neural compression. 4. L4-5: Moderate multifactorial stenosis that could be symptomatic. This stenosis actually appeared worse in 2015. 5. L5-S1: Mild bilateral foraminal narrowing but without definite neural compression. 6. Discogenic endplate marrow changes on the right at L1-2 with associated edema. This could relate to regional back pain.     Electronically Signed   By: Paulina Fusi M.D.   On: 01/12/2022 10:50   He reports that he quit smoking about 47 years ago. His smoking use included cigarettes. He has never used smokeless tobacco. No results for input(s): "HGBA1C", "LABURIC" in the last 8760 hours.  Objective:  VS:  HT:    WT:   BMI:     BP:124/76  HR:73bpm  TEMP: ( )  RESP:  Physical Exam Vitals and nursing note reviewed.  HENT:     Head:  Normocephalic and atraumatic.     Right Ear: External ear normal.     Left Ear: External ear normal.     Nose: Nose normal.     Mouth/Throat:     Mouth: Mucous membranes are dry.  Eyes:     Pupils: Pupils are equal, round, and reactive to light.  Cardiovascular:     Rate and Rhythm: Normal rate.     Pulses: Normal pulses.  Pulmonary:     Effort: Pulmonary effort is normal.  Abdominal:     General: Abdomen is flat. There is no distension.  Musculoskeletal:        General: Tenderness present.     Cervical back: Normal range of motion.     Comments: Patient is slow to rise from seated position to standing. Good lumbar range of motion. No pain noted with facet loading. 5/5 strength noted with bilateral hip flexion, knee flexion/extension, ankle dorsiflexion/plantarflexion and EHL. No clonus noted bilaterally. No pain upon palpation of greater trochanters. No pain with internal/external rotation of bilateral hips. Sensation intact bilaterally. Negative slump test bilaterally. Ambulates without with cane, gait slow and unsteady.     Skin:    General: Skin is warm and dry.     Capillary Refill: Capillary refill takes less than 2 seconds.  Neurological:     Mental Status: He is alert and  oriented to person, place, and time.     Gait: Gait abnormal.     Ortho Exam  Imaging: No results found.  Past Medical/Family/Surgical/Social History: Medications & Allergies reviewed per EMR, new medications updated. Patient Active Problem List   Diagnosis Date Noted   Dizziness 11/16/2022   Sensorineural hearing loss, bilateral 11/16/2022   Lumbar radiculopathy 03/21/2019   Spinal stenosis of lumbar region with neurogenic claudication 03/21/2019   Spondylosis without myelopathy or radiculopathy, lumbar region 03/21/2019   Pain in left ankle and joints of left foot 03/21/2019   Past Medical History:  Diagnosis Date   Arthritis    Cataract    Chronic kidney disease    Enlarged prostate     GERD (gastroesophageal reflux disease)    Hyperlipidemia    Hypertension    Family History  Problem Relation Age of Onset   Colon cancer Neg Hx    Esophageal cancer Neg Hx    Rectal cancer Neg Hx    Stomach cancer Neg Hx    Past Surgical History:  Procedure Laterality Date   ANGIOGRAM/LV (CONGENITAL)     Bladder Stent     COLONOSCOPY     POLYPECTOMY     TONSILLECTOMY     Social History   Occupational History   Not on file  Tobacco Use   Smoking status: Former    Current packs/day: 0.00    Types: Cigarettes    Quit date: 1978    Years since quitting: 47.2   Smokeless tobacco: Never  Substance and Sexual Activity   Alcohol use: Yes    Comment: 2-3 glassses of wine    Drug use: No   Sexual activity: Not on file

## 2023-03-13 NOTE — Progress Notes (Unsigned)
 Pain Scale   Average Pain 5

## 2023-03-14 ENCOUNTER — Telehealth: Payer: Self-pay

## 2023-03-14 MED ORDER — GABAPENTIN 100 MG PO CAPS
100.0000 mg | ORAL_CAPSULE | Freq: Three times a day (TID) | ORAL | 1 refills | Status: DC
Start: 2023-03-14 — End: 2023-05-22

## 2023-03-14 NOTE — Telephone Encounter (Signed)
 Patient requesting refill on his gabapentin-mg To [facility]  3 month supply if he can.( He advised he takes 1-tid

## 2023-03-15 DIAGNOSIS — M545 Low back pain, unspecified: Secondary | ICD-10-CM | POA: Diagnosis not present

## 2023-03-17 DIAGNOSIS — M545 Low back pain, unspecified: Secondary | ICD-10-CM | POA: Diagnosis not present

## 2023-03-20 DIAGNOSIS — M545 Low back pain, unspecified: Secondary | ICD-10-CM | POA: Diagnosis not present

## 2023-03-22 DIAGNOSIS — M545 Low back pain, unspecified: Secondary | ICD-10-CM | POA: Diagnosis not present

## 2023-03-24 DIAGNOSIS — M545 Low back pain, unspecified: Secondary | ICD-10-CM | POA: Diagnosis not present

## 2023-03-27 ENCOUNTER — Other Ambulatory Visit: Payer: Self-pay

## 2023-03-27 ENCOUNTER — Ambulatory Visit: Admitting: Physical Medicine and Rehabilitation

## 2023-03-27 VITALS — BP 137/76 | HR 66

## 2023-03-27 DIAGNOSIS — M5416 Radiculopathy, lumbar region: Secondary | ICD-10-CM

## 2023-03-27 MED ORDER — METHYLPREDNISOLONE ACETATE 40 MG/ML IJ SUSP
40.0000 mg | Freq: Once | INTRAMUSCULAR | Status: AC
  Administered 2023-03-27: 40 mg

## 2023-03-27 NOTE — Progress Notes (Unsigned)
 Pain Scale   Average Pain 4        +Driver, -BT, -Dye Allergies.

## 2023-03-27 NOTE — Patient Instructions (Signed)

## 2023-03-29 ENCOUNTER — Encounter (INDEPENDENT_AMBULATORY_CARE_PROVIDER_SITE_OTHER): Payer: Medicare Other | Admitting: Ophthalmology

## 2023-03-29 DIAGNOSIS — H43813 Vitreous degeneration, bilateral: Secondary | ICD-10-CM

## 2023-03-29 DIAGNOSIS — I10 Essential (primary) hypertension: Secondary | ICD-10-CM

## 2023-03-29 DIAGNOSIS — H353221 Exudative age-related macular degeneration, left eye, with active choroidal neovascularization: Secondary | ICD-10-CM | POA: Diagnosis not present

## 2023-03-29 DIAGNOSIS — H353112 Nonexudative age-related macular degeneration, right eye, intermediate dry stage: Secondary | ICD-10-CM

## 2023-03-29 DIAGNOSIS — H35033 Hypertensive retinopathy, bilateral: Secondary | ICD-10-CM

## 2023-03-31 NOTE — Progress Notes (Signed)
 Paul Herring - 83 y.o. male MRN [id number]  Date of birth: July 11, 1940  Office Visit Note: Visit Date: 03/27/2023 PCP: Cleatis Polka., MD Referred by: Cleatis Polka., MD  Subjective: Chief Complaint  Patient presents with   Lower Back - Pain   HPI:  Paul Herring is a 83 y.o. male who comes in today at the request of Ellin Goodie, FNP for planned Bilateral L4-5 Lumbar Transforaminal epidural steroid injection with fluoroscopic guidance.  The patient has failed conservative care including home exercise, medications, time and activity modification.  This injection will be diagnostic and hopefully therapeutic.  Please see requesting physician notes for further details and justification.   ROS Otherwise per HPI.  Assessment & Plan: Visit Diagnoses:    ICD-10-CM   1. Lumbar radiculopathy  M54.16 XR C-ARM NO REPORT    Epidural Steroid injection    methylPREDNISolone acetate (DEPO-MEDROL) injection 40 mg      Plan: No additional findings.   Meds & Orders:  Meds ordered this encounter  Medications   methylPREDNISolone acetate (DEPO-MEDROL) injection 40 mg    Orders Placed This Encounter  Procedures   XR C-ARM NO REPORT   Epidural Steroid injection    Follow-up: Return if symptoms worsen or fail to improve.   Procedures: No procedures performed  Lumbosacral Transforaminal Epidural Steroid Injection - Sub-Pedicular Approach with Fluoroscopic Guidance  Patient: Paul Herring      Date of Birth: 12-Jun-1940 MRN: [id number] PCP: Cleatis Polka., MD      Visit Date: 03/27/2023   Universal Protocol:    Date/Time: 03/27/2023  Consent Given By: the patient  Position: PRONE  Additional Comments: Vital signs were monitored before and after the procedure. Patient was prepped and draped in the usual sterile fashion. The correct patient, procedure, and site was verified.   Injection Procedure Details:   Procedure diagnoses: Lumbar  radiculopathy [M54.16]    Meds Administered:  Meds ordered this encounter  Medications   methylPREDNISolone acetate (DEPO-MEDROL) injection 40 mg    Laterality: Bilateral  Location/Site: L4  Needle:5.0 in., 22 ga.  Short bevel or Quincke spinal needle  Needle Placement: Transforaminal  Findings:    -Comments: Excellent flow of contrast along the nerve, nerve root and into the epidural space.  Procedure Details: After squaring off the end-plates to get a true AP view, the C-arm was positioned so that an oblique view of the foramen as noted above was visualized. The target area is just inferior to the "nose of the scotty dog" or sub pedicular. The soft tissues overlying this structure were infiltrated with 2-3 ml. of 1% Lidocaine without Epinephrine.  The spinal needle was inserted toward the target using a "trajectory" view along the fluoroscope beam.  Under AP and lateral visualization, the needle was advanced so it did not puncture dura and was located close the 6 O'Clock position of the pedical in AP tracterory. Biplanar projections were used to confirm position. Aspiration was confirmed to be negative for CSF and/or blood. A 1-2 ml. volume of Isovue-250 was injected and flow of contrast was noted at each level. Radiographs were obtained for documentation purposes.   After attaining the desired flow of contrast documented above, a 0.5 to 1.0 ml test dose of 0.25% Marcaine was injected into each respective transforaminal space.  The patient was observed for 90 seconds post injection.  After no sensory deficits were reported, and normal lower extremity motor function was noted,  the above injectate was administered so that equal amounts of the injectate were placed at each foramen (level) into the transforaminal epidural space.   Additional Comments:  The patient tolerated the procedure well Dressing: 2 x 2 sterile gauze and Band-Aid    Post-procedure details: Patient was observed  during the procedure. Post-procedure instructions were reviewed.  Patient left the clinic in stable condition.    Clinical History: MRI LUMBAR SPINE WITHOUT CONTRAST   TECHNIQUE: Multiplanar, multisequence MR imaging of the lumbar spine was performed. No intravenous contrast was administered.   COMPARISON:  05/29/2013   FINDINGS: Segmentation:  5 lumbar type vertebral bodies.   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 No antero or retrolisthesis.   Vertebrae: Edematous degenerative endplate changes on the right at L1-2. Chronic endplate marrow changes at the other levels without edema. Benign appearing hemangioma or lipoma within the left posterior vertebral body at L2.   Conus medullaris and cauda equina: Conus extends to the L1-2 level. Conus and cauda equina appear normal.   Paraspinal and other soft tissues: Negative   Disc levels:   T12-L1: Normal   L1-2: Minimal disc bulge.  No stenosis.   L2-3: Endplate osteophytes and mild bulging of the disc. Mild narrowing of the right lateral recess and intervertebral foramen on the right without likely neural compression.   L3-4: Endplate osteophytes and bulging of the disc. Prominent epidural fat. Mild multifactorial stenosis of the canal and foramina but without definite neural compression.   L4-5: Endplate osteophytes and bulging of the disc. Facet and ligamentous hypertrophy. Prominent epidural fat. Constriction of the thecal sac. Moderate stenosis at this level could be symptomatic. This stenosis actually appeared more severe in 2015.   L5-S1: Endplate osteophytes and mild bulging of the disc. Bilateral facet osteoarthritis. Mild bilateral foraminal narrowing but without definite compression of the exiting L5 nerves.   IMPRESSION: 1. 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 2. L2-3: Mild right lateral recess and foraminal narrowing but without definite  neural compression. 3. L3-4: Mild multifactorial stenosis but without definite neural compression. 4. L4-5: Moderate multifactorial stenosis that could be symptomatic. This stenosis actually appeared worse in 2015. 5. L5-S1: Mild bilateral foraminal narrowing but without definite neural compression. 6. Discogenic endplate marrow changes on the right at L1-2 with associated edema. This could relate to regional back pain.     Electronically Signed   By: Paulina Fusi M.D.   On: 01/12/2022 10:50     Objective:  VS:  HT:    WT:   BMI:     BP:137/76  HR:66bpm  TEMP: ( )  RESP:  Physical Exam Vitals and nursing note reviewed.  Constitutional:      General: He is not in acute distress.    Appearance: Normal appearance. He is not ill-appearing.  HENT:     Head: Normocephalic and atraumatic.     Right Ear: External ear normal.     Left Ear: External ear normal.     Nose: No congestion.  Eyes:     Extraocular Movements: Extraocular movements intact.  Cardiovascular:     Rate and Rhythm: Normal rate.     Pulses: Normal pulses.  Pulmonary:     Effort: Pulmonary effort is normal. No respiratory distress.  Abdominal:     General: There is no distension.     Palpations: Abdomen is soft.  Musculoskeletal:        General: No tenderness  or signs of injury.     Cervical back: Neck supple.     Right lower leg: No edema.     Left lower leg: No edema.     Comments: Patient has good distal strength without clonus.  Skin:    Findings: No erythema or rash.  Neurological:     General: No focal deficit present.     Mental Status: He is alert and oriented to person, place, and time.     Sensory: No sensory deficit.     Motor: No weakness or abnormal muscle tone.     Coordination: Coordination normal.  Psychiatric:        Mood and Affect: Mood normal.        Behavior: Behavior normal.      Imaging: No results found.

## 2023-03-31 NOTE — Procedures (Signed)
 Lumbosacral Transforaminal Epidural Steroid Injection - Sub-Pedicular Approach with Fluoroscopic Guidance  Patient: Paul Herring      Date of Birth: 1940/10/14 MRN: [id number] PCP: Cleatis Polka., MD      Visit Date: 03/27/2023   Universal Protocol:    Date/Time: 03/27/2023  Consent Given By: the patient  Position: PRONE  Additional Comments: Vital signs were monitored before and after the procedure. Patient was prepped and draped in the usual sterile fashion. The correct patient, procedure, and site was verified.   Injection Procedure Details:   Procedure diagnoses: Lumbar radiculopathy [M54.16]    Meds Administered:  Meds ordered this encounter  Medications   methylPREDNISolone acetate (DEPO-MEDROL) injection 40 mg    Laterality: Bilateral  Location/Site: L4  Needle:5.0 in., 22 ga.  Short bevel or Quincke spinal needle  Needle Placement: Transforaminal  Findings:    -Comments: Excellent flow of contrast along the nerve, nerve root and into the epidural space.  Procedure Details: After squaring off the end-plates to get a true AP view, the C-arm was positioned so that an oblique view of the foramen as noted above was visualized. The target area is just inferior to the "nose of the scotty dog" or sub pedicular. The soft tissues overlying this structure were infiltrated with 2-3 ml. of 1% Lidocaine without Epinephrine.  The spinal needle was inserted toward the target using a "trajectory" view along the fluoroscope beam.  Under AP and lateral visualization, the needle was advanced so it did not puncture dura and was located close the 6 O'Clock position of the pedical in AP tracterory. Biplanar projections were used to confirm position. Aspiration was confirmed to be negative for CSF and/or blood. A 1-2 ml. volume of Isovue-250 was injected and flow of contrast was noted at each level. Radiographs were obtained for documentation purposes.   After attaining  the desired flow of contrast documented above, a 0.5 to 1.0 ml test dose of 0.25% Marcaine was injected into each respective transforaminal space.  The patient was observed for 90 seconds post injection.  After no sensory deficits were reported, and normal lower extremity motor function was noted,   the above injectate was administered so that equal amounts of the injectate were placed at each foramen (level) into the transforaminal epidural space.   Additional Comments:  The patient tolerated the procedure well Dressing: 2 x 2 sterile gauze and Band-Aid    Post-procedure details: Patient was observed during the procedure. Post-procedure instructions were reviewed.  Patient left the clinic in stable condition.

## 2023-04-03 DIAGNOSIS — M545 Low back pain, unspecified: Secondary | ICD-10-CM | POA: Diagnosis not present

## 2023-04-05 DIAGNOSIS — M545 Low back pain, unspecified: Secondary | ICD-10-CM | POA: Diagnosis not present

## 2023-04-07 DIAGNOSIS — M545 Low back pain, unspecified: Secondary | ICD-10-CM | POA: Diagnosis not present

## 2023-04-10 DIAGNOSIS — M545 Low back pain, unspecified: Secondary | ICD-10-CM | POA: Diagnosis not present

## 2023-04-12 DIAGNOSIS — M545 Low back pain, unspecified: Secondary | ICD-10-CM | POA: Diagnosis not present

## 2023-04-14 DIAGNOSIS — M545 Low back pain, unspecified: Secondary | ICD-10-CM | POA: Diagnosis not present

## 2023-04-17 DIAGNOSIS — M545 Low back pain, unspecified: Secondary | ICD-10-CM | POA: Diagnosis not present

## 2023-04-21 DIAGNOSIS — M545 Low back pain, unspecified: Secondary | ICD-10-CM | POA: Diagnosis not present

## 2023-04-25 DIAGNOSIS — M545 Low back pain, unspecified: Secondary | ICD-10-CM | POA: Diagnosis not present

## 2023-04-27 DIAGNOSIS — M545 Low back pain, unspecified: Secondary | ICD-10-CM | POA: Diagnosis not present

## 2023-05-04 DIAGNOSIS — M545 Low back pain, unspecified: Secondary | ICD-10-CM | POA: Diagnosis not present

## 2023-05-09 DIAGNOSIS — M545 Low back pain, unspecified: Secondary | ICD-10-CM | POA: Diagnosis not present

## 2023-05-10 ENCOUNTER — Encounter (INDEPENDENT_AMBULATORY_CARE_PROVIDER_SITE_OTHER): Admitting: Ophthalmology

## 2023-05-10 DIAGNOSIS — H43813 Vitreous degeneration, bilateral: Secondary | ICD-10-CM | POA: Diagnosis not present

## 2023-05-10 DIAGNOSIS — H35033 Hypertensive retinopathy, bilateral: Secondary | ICD-10-CM | POA: Diagnosis not present

## 2023-05-10 DIAGNOSIS — H353112 Nonexudative age-related macular degeneration, right eye, intermediate dry stage: Secondary | ICD-10-CM | POA: Diagnosis not present

## 2023-05-10 DIAGNOSIS — I10 Essential (primary) hypertension: Secondary | ICD-10-CM

## 2023-05-10 DIAGNOSIS — H353221 Exudative age-related macular degeneration, left eye, with active choroidal neovascularization: Secondary | ICD-10-CM

## 2023-05-16 DIAGNOSIS — M545 Low back pain, unspecified: Secondary | ICD-10-CM | POA: Diagnosis not present

## 2023-05-18 DIAGNOSIS — M545 Low back pain, unspecified: Secondary | ICD-10-CM | POA: Diagnosis not present

## 2023-05-22 ENCOUNTER — Telehealth: Payer: Self-pay | Admitting: Physical Medicine and Rehabilitation

## 2023-05-22 MED ORDER — GABAPENTIN 100 MG PO CAPS: 100.0000 mg | ORAL_CAPSULE | Freq: Three times a day (TID) | ORAL | 1 refills | Status: DC

## 2023-05-22 NOTE — Telephone Encounter (Signed)
 Pt request call to discuss his options, Pt states the injection wore off to soon.

## 2023-05-24 DIAGNOSIS — M545 Low back pain, unspecified: Secondary | ICD-10-CM | POA: Diagnosis not present

## 2023-05-30 DIAGNOSIS — M545 Low back pain, unspecified: Secondary | ICD-10-CM | POA: Diagnosis not present

## 2023-06-01 DIAGNOSIS — N401 Enlarged prostate with lower urinary tract symptoms: Secondary | ICD-10-CM | POA: Diagnosis not present

## 2023-06-01 DIAGNOSIS — K219 Gastro-esophageal reflux disease without esophagitis: Secondary | ICD-10-CM | POA: Diagnosis not present

## 2023-06-01 DIAGNOSIS — M109 Gout, unspecified: Secondary | ICD-10-CM | POA: Diagnosis not present

## 2023-06-01 DIAGNOSIS — N1831 Chronic kidney disease, stage 3a: Secondary | ICD-10-CM | POA: Diagnosis not present

## 2023-06-01 DIAGNOSIS — E785 Hyperlipidemia, unspecified: Secondary | ICD-10-CM | POA: Diagnosis not present

## 2023-06-01 DIAGNOSIS — I129 Hypertensive chronic kidney disease with stage 1 through stage 4 chronic kidney disease, or unspecified chronic kidney disease: Secondary | ICD-10-CM | POA: Diagnosis not present

## 2023-06-01 DIAGNOSIS — R7301 Impaired fasting glucose: Secondary | ICD-10-CM | POA: Diagnosis not present

## 2023-06-02 DIAGNOSIS — M545 Low back pain, unspecified: Secondary | ICD-10-CM | POA: Diagnosis not present

## 2023-06-06 DIAGNOSIS — M545 Low back pain, unspecified: Secondary | ICD-10-CM | POA: Diagnosis not present

## 2023-06-07 ENCOUNTER — Ambulatory Visit: Admitting: Physical Medicine and Rehabilitation

## 2023-06-09 ENCOUNTER — Other Ambulatory Visit (HOSPITAL_BASED_OUTPATIENT_CLINIC_OR_DEPARTMENT_OTHER): Payer: Self-pay

## 2023-06-09 ENCOUNTER — Emergency Department (HOSPITAL_BASED_OUTPATIENT_CLINIC_OR_DEPARTMENT_OTHER)
Admission: EM | Admit: 2023-06-09 | Discharge: 2023-06-09 | Disposition: A | Discharge status: Home / Self Care | Attending: Emergency Medicine | Admitting: Emergency Medicine

## 2023-06-09 ENCOUNTER — Other Ambulatory Visit: Payer: Self-pay

## 2023-06-09 ENCOUNTER — Emergency Department (HOSPITAL_BASED_OUTPATIENT_CLINIC_OR_DEPARTMENT_OTHER)

## 2023-06-09 DIAGNOSIS — W19XXXA Unspecified fall, initial encounter: Secondary | ICD-10-CM | POA: Diagnosis not present

## 2023-06-09 DIAGNOSIS — S0990XA Unspecified injury of head, initial encounter: Secondary | ICD-10-CM | POA: Diagnosis not present

## 2023-06-09 DIAGNOSIS — Z87891 Personal history of nicotine dependence: Secondary | ICD-10-CM | POA: Insufficient documentation

## 2023-06-09 DIAGNOSIS — M5459 Other low back pain: Secondary | ICD-10-CM | POA: Diagnosis not present

## 2023-06-09 DIAGNOSIS — G8929 Other chronic pain: Secondary | ICD-10-CM | POA: Diagnosis not present

## 2023-06-09 DIAGNOSIS — I10 Essential (primary) hypertension: Secondary | ICD-10-CM | POA: Diagnosis not present

## 2023-06-09 DIAGNOSIS — I700 Atherosclerosis of aorta: Secondary | ICD-10-CM | POA: Diagnosis not present

## 2023-06-09 DIAGNOSIS — Z79899 Other long term (current) drug therapy: Secondary | ICD-10-CM | POA: Diagnosis not present

## 2023-06-09 DIAGNOSIS — R9082 White matter disease, unspecified: Secondary | ICD-10-CM | POA: Diagnosis not present

## 2023-06-09 DIAGNOSIS — M545 Low back pain, unspecified: Secondary | ICD-10-CM | POA: Diagnosis not present

## 2023-06-09 DIAGNOSIS — S3992XA Unspecified injury of lower back, initial encounter: Secondary | ICD-10-CM | POA: Diagnosis not present

## 2023-06-09 DIAGNOSIS — M549 Dorsalgia, unspecified: Secondary | ICD-10-CM | POA: Diagnosis not present

## 2023-06-09 DIAGNOSIS — M5030 Other cervical disc degeneration, unspecified cervical region: Secondary | ICD-10-CM | POA: Diagnosis not present

## 2023-06-09 DIAGNOSIS — R58 Hemorrhage, not elsewhere classified: Secondary | ICD-10-CM | POA: Diagnosis not present

## 2023-06-09 DIAGNOSIS — S299XXA Unspecified injury of thorax, initial encounter: Secondary | ICD-10-CM | POA: Diagnosis not present

## 2023-06-09 MED ORDER — HYDROMORPHONE HCL 1 MG/ML IJ SOLN
2.0000 mg | Freq: Once | INTRAMUSCULAR | Status: AC
  Administered 2023-06-09: 2 mg via INTRAMUSCULAR
  Filled 2023-06-09: qty 2

## 2023-06-09 MED ORDER — HYDROCODONE-ACETAMINOPHEN 5-325 MG PO TABS
1.0000 | ORAL_TABLET | Freq: Four times a day (QID) | ORAL | 0 refills | Status: DC | PRN
  Filled 2023-06-09: qty 14, 4d supply, fill #0

## 2023-06-09 NOTE — ED Notes (Signed)
 Pt with occasional desaturation to 89%.  Pt placed on 2l [place] with sats of 95%, HR 65, RR 18.  RN aware.

## 2023-06-09 NOTE — ED Triage Notes (Signed)
 BIB [facility] from home. C/o mid-back pain. Recent fall on Thursday night. Hit head. Denies LOC. No thinners.   Seen by PCP for same back pain. Given muscle relaxers but states not helping pain.

## 2023-06-09 NOTE — ED Provider Notes (Addendum)
 [place] EMERGENCY DEPARTMENT AT [facility] Provider Note   CSN: [id number] Arrival date & time: 06/09/23  1191     History  Chief Complaint  Patient presents with   Back Pain    Paul Herring is a 83 y.o. male.  Patient with fall Wednesday evening fell backwards hit the back of the head.  No loss of consciousness.  Patient not on blood thinners.  Patient states she had increased back pain at the thoracic lumbar junction since the fall.  He does have a history of chronic back pain.  Currently followed by Ortho care and physical medicine and rehab.  Patient had an injection in his back mid March.  Patient is on gabapentin  on a regular basis for the pain in.  Patient states that the pain is increased he could not get out of bed this morning he did come in by EMS.  Patient denies any new weakness or numbness to the legs.  Past medical history significant for chronic back pain hyperlipidemia hypertension patient a former smoker quit 1978.       Home Medications Prior to Admission medications   Medication Sig Start Date End Date Taking? Authorizing Provider  amLODipine (NORVASC) 10 MG tablet TK 1 T PO QD 09/08/15   [provider]  Ascorbic Acid (VITAMIN C) 1000 MG tablet Take 1,000 mg by mouth daily.    [provider]  atorvastatin (LIPITOR) 20 MG tablet TK 1 T PO HS 09/08/15   [provider]  B Complex-C (SUPER B COMPLEX PO) Take by mouth.    [provider]  baclofen (LIORESAL) 10 MG tablet Take 10 mg by mouth every 8 (eight) hours as needed. 02/21/22   [provider]  betamethasone  dipropionate (DIPROLENE ) 0.05 % cream Apply topically 2 (two) times daily.    [provider]  co-enzyme Q-10 30 MG capsule Take 30 mg by mouth daily.    [provider]  diazepam  (VALIUM ) 5 MG tablet Take one tablet by mouth with food one hour prior to procedure. May repeat 30 minutes prior if needed. 09/22/21   Williams, Megan  E, NP  finasteride (PROSCAR) 5 MG tablet TK 1 T PO D UTD 09/07/15   [provider]  gabapentin  (NEURONTIN ) 100 MG capsule Take 1 capsule (100 mg total) by mouth 3 (three) times daily. 05/22/23   Bridget Campion, MD  Glucosamine-Chondroitin 500-250 MG CAPS Take 1 tablet by mouth 3 (three) times daily.    [provider]  losartan (COZAAR) 100 MG tablet TK 1 T PO D 09/07/15   [provider]  Multiple Vitamins-Minerals (OCUVITE ADULT 50+ PO) Take by mouth.    [provider]  multivitamin-lutein (OCUVITE-LUTEIN) CAPS capsule Take 1 capsule by mouth daily.    [provider]  Omega-3 Fatty Acids (FISH OIL) 1000 MG CAPS Take by mouth.    [provider]  omeprazole (PRILOSEC) 20 MG capsule TK 1 C PO QD 10/10/15   [provider]  tamsulosin (FLOMAX) 0.4 MG CAPS capsule TK 1 C PO BID 09/07/15   [provider]  vitamin E 400 UNIT capsule Take 400 Units by mouth daily.    [provider]      Allergies    Pentazocine lactate    Review of Systems   Review of Systems  Constitutional:  Negative for chills and fever.  HENT:  Negative for ear pain and sore throat.   Eyes:  Negative for pain and  visual disturbance.  Respiratory:  Negative for cough and shortness of breath.   Cardiovascular:  Negative for chest pain and palpitations.  Gastrointestinal:  Negative for abdominal pain, nausea and vomiting.  Genitourinary:  Negative for dysuria and hematuria.  Musculoskeletal:  Positive for back pain. Negative for arthralgias, neck pain and neck stiffness.  Skin:  Negative for color change and rash.  Neurological:  Negative for seizures, syncope and weakness.  All other systems reviewed and are negative.   Physical Exam Updated Vital Signs BP (!) 145/60 (BP Location: Right Arm)   Pulse 71   Temp 98 F (36.7 C) (Oral)   Resp 18   SpO2 99%  Physical Exam Vitals and nursing note reviewed.  Constitutional:      General: He  is not in acute distress.    Appearance: Normal appearance. He is well-developed.  HENT:     Head: Normocephalic and atraumatic.  Eyes:     Conjunctiva/sclera: Conjunctivae normal.     Pupils: Pupils are equal, round, and reactive to light.  Cardiovascular:     Rate and Rhythm: Normal rate and regular rhythm.     Heart sounds: No murmur heard. Pulmonary:     Effort: Pulmonary effort is normal. No respiratory distress.     Breath sounds: Normal breath sounds.  Abdominal:     Palpations: Abdomen is soft.     Tenderness: There is no abdominal tenderness.  Musculoskeletal:        General: No swelling.     Cervical back: Normal range of motion and neck supple. No rigidity.  Skin:    General: Skin is warm and dry.     Capillary Refill: Capillary refill takes less than 2 seconds.  Neurological:     General: No focal deficit present.     Mental Status: He is alert and oriented to person, place, and time.     Cranial Nerves: No cranial nerve deficit.     Sensory: No sensory deficit.     Motor: No weakness.  Psychiatric:        Mood and Affect: Mood normal.     ED Results / Procedures / Treatments   Labs (all labs ordered are listed, but only abnormal results are displayed) Labs Reviewed - No data to display  EKG None  Radiology No results found.  Procedures Procedures    Medications Ordered in ED Medications  HYDROmorphone (DILAUDID) injection 2 mg (has no administration in time range)    ED Course/ Medical Decision Making/ A&P                                 Medical Decision Making Amount and/or Complexity of Data Reviewed Radiology: ordered.  Risk Prescription drug management.   Will get CT head CT thoracic and lumbar spine to make sure no new injury related to the fall.  Patient does have a history of chronic back pain.  Does have follow-up with orthopedics again next week.  No significant neurodeficit.  CT head without any acute findings.  CT lumbar  spine no fracture or dislocation of the thoracic or lumbar spine.  Severe multilevel degenerative changes.  Degenerative change findings in the lumbar spine not significantly changed compared to MRI dated January 2024.  Intent of the CT was to rule out traumatic injury.  No evidence of that.  Will treat patient symptomatically.   Final Clinical Impression(s) / ED Diagnoses Final diagnoses:  Fall, initial encounter  Chronic midline low back pain without sciatica    Rx / DC Orders ED Discharge Orders     None         Nicklas Barns, MD 06/09/23 1055    Nicklas Barns, MD 06/09/23 1229

## 2023-06-09 NOTE — Discharge Instructions (Signed)
 Take the hydrocodone as directed for pain.  Follow-up with your orthopedic doctors as scheduled.  CT head CT thoracic back and lumbar back without any acute injury.  Take the prescription to the pharmacy here to have it filled.  Tried to send it electronically but it would not go.

## 2023-06-09 NOTE — ED Notes (Addendum)
 DC paperwork given and verbally understood.... Pt and family stated they were going to contact family to transport him them home... They were then going to figure out how to get in the house from there.Paul AasAaron Herring

## 2023-06-12 ENCOUNTER — Other Ambulatory Visit: Payer: Self-pay | Admitting: Physical Medicine and Rehabilitation

## 2023-06-12 ENCOUNTER — Telehealth: Payer: Self-pay | Admitting: Orthopedic Surgery

## 2023-06-12 ENCOUNTER — Telehealth: Payer: Self-pay | Admitting: Physical Medicine and Rehabilitation

## 2023-06-12 ENCOUNTER — Ambulatory Visit: Admitting: Physical Medicine and Rehabilitation

## 2023-06-12 MED ORDER — HYDROCODONE-ACETAMINOPHEN 5-325 MG PO TABS: 1.0000 | ORAL_TABLET | Freq: Four times a day (QID) | ORAL | 0 refills | Status: AC | PRN

## 2023-06-12 NOTE — Telephone Encounter (Signed)
 Patient fell. Need to [facility] with Megan. Call wife. [phone] Amalia Badder

## 2023-06-12 NOTE — Telephone Encounter (Signed)
 Paul Herring would like to speak with Paul Herring regarding his back pain.  He states that he had to cancel his appt today because he was in too much pain. Was seen Friday 6/6 at [facility] ER after an incident involving some TV trays and he was given pain medication that is not helping.  Please call him to discuss what can possibly be done to help him.  Call back # is [phone].

## 2023-06-12 NOTE — Telephone Encounter (Signed)
 Spoke with patient on telephone today. He would like to cancel his appointment today. Reports severe left sided lower back pain post fall on 06/09/2023. He was evaluated in emergency room post fall. Recent CT imaging of thoracic and lumbar spine shows no acute fractures. He is currently taking Norco, Robaxin and Flexeril. I encouraged him that we would need him to come in for evaluation. He would prefer to give his pain some time before coming in office. He did inquire about having injection, again we would need to evaluate him before decided which injection he needs. We would unlikely be able to do evaluation and injection on same day, he is aware of this. I did refill Norco for him today, encouraged him to call back as needed.

## 2023-06-21 ENCOUNTER — Encounter (INDEPENDENT_AMBULATORY_CARE_PROVIDER_SITE_OTHER): Admitting: Ophthalmology

## 2023-06-28 ENCOUNTER — Encounter (INDEPENDENT_AMBULATORY_CARE_PROVIDER_SITE_OTHER): Admitting: Ophthalmology

## 2023-06-28 DIAGNOSIS — I10 Essential (primary) hypertension: Secondary | ICD-10-CM | POA: Diagnosis not present

## 2023-06-28 DIAGNOSIS — H43812 Vitreous degeneration, left eye: Secondary | ICD-10-CM | POA: Diagnosis not present

## 2023-06-28 DIAGNOSIS — H35033 Hypertensive retinopathy, bilateral: Secondary | ICD-10-CM

## 2023-06-28 DIAGNOSIS — H353112 Nonexudative age-related macular degeneration, right eye, intermediate dry stage: Secondary | ICD-10-CM

## 2023-06-28 DIAGNOSIS — H353221 Exudative age-related macular degeneration, left eye, with active choroidal neovascularization: Secondary | ICD-10-CM | POA: Diagnosis not present

## 2023-07-05 ENCOUNTER — Encounter (INDEPENDENT_AMBULATORY_CARE_PROVIDER_SITE_OTHER): Admitting: Ophthalmology

## 2023-07-20 DIAGNOSIS — R82998 Other abnormal findings in urine: Secondary | ICD-10-CM | POA: Diagnosis not present

## 2023-07-20 DIAGNOSIS — H353220 Exudative age-related macular degeneration, left eye, stage unspecified: Secondary | ICD-10-CM | POA: Diagnosis not present

## 2023-07-20 DIAGNOSIS — Z0000 Encounter for general adult medical examination without abnormal findings: Secondary | ICD-10-CM | POA: Diagnosis not present

## 2023-07-20 DIAGNOSIS — I129 Hypertensive chronic kidney disease with stage 1 through stage 4 chronic kidney disease, or unspecified chronic kidney disease: Secondary | ICD-10-CM | POA: Diagnosis not present

## 2023-07-20 DIAGNOSIS — R7301 Impaired fasting glucose: Secondary | ICD-10-CM | POA: Diagnosis not present

## 2023-07-20 DIAGNOSIS — Z1331 Encounter for screening for depression: Secondary | ICD-10-CM | POA: Diagnosis not present

## 2023-07-20 DIAGNOSIS — M109 Gout, unspecified: Secondary | ICD-10-CM | POA: Diagnosis not present

## 2023-07-20 DIAGNOSIS — Z860101 Personal history of adenomatous and serrated colon polyps: Secondary | ICD-10-CM | POA: Diagnosis not present

## 2023-07-20 DIAGNOSIS — R2681 Unsteadiness on feet: Secondary | ICD-10-CM | POA: Diagnosis not present

## 2023-07-20 DIAGNOSIS — E785 Hyperlipidemia, unspecified: Secondary | ICD-10-CM | POA: Diagnosis not present

## 2023-07-20 DIAGNOSIS — Z1339 Encounter for screening examination for other mental health and behavioral disorders: Secondary | ICD-10-CM | POA: Diagnosis not present

## 2023-07-20 DIAGNOSIS — M549 Dorsalgia, unspecified: Secondary | ICD-10-CM | POA: Diagnosis not present

## 2023-07-20 DIAGNOSIS — G629 Polyneuropathy, unspecified: Secondary | ICD-10-CM | POA: Diagnosis not present

## 2023-07-20 DIAGNOSIS — N1831 Chronic kidney disease, stage 3a: Secondary | ICD-10-CM | POA: Diagnosis not present

## 2023-07-20 DIAGNOSIS — K219 Gastro-esophageal reflux disease without esophagitis: Secondary | ICD-10-CM | POA: Diagnosis not present

## 2023-07-20 DIAGNOSIS — N401 Enlarged prostate with lower urinary tract symptoms: Secondary | ICD-10-CM | POA: Diagnosis not present

## 2023-08-10 DIAGNOSIS — M545 Low back pain, unspecified: Secondary | ICD-10-CM | POA: Diagnosis not present

## 2023-08-15 DIAGNOSIS — M545 Low back pain, unspecified: Secondary | ICD-10-CM | POA: Diagnosis not present

## 2023-08-16 ENCOUNTER — Encounter (INDEPENDENT_AMBULATORY_CARE_PROVIDER_SITE_OTHER): Admitting: Ophthalmology

## 2023-08-16 DIAGNOSIS — H35033 Hypertensive retinopathy, bilateral: Secondary | ICD-10-CM

## 2023-08-16 DIAGNOSIS — H353112 Nonexudative age-related macular degeneration, right eye, intermediate dry stage: Secondary | ICD-10-CM | POA: Diagnosis not present

## 2023-08-16 DIAGNOSIS — I10 Essential (primary) hypertension: Secondary | ICD-10-CM

## 2023-08-16 DIAGNOSIS — H353221 Exudative age-related macular degeneration, left eye, with active choroidal neovascularization: Secondary | ICD-10-CM | POA: Diagnosis not present

## 2023-08-16 DIAGNOSIS — H43813 Vitreous degeneration, bilateral: Secondary | ICD-10-CM | POA: Diagnosis not present

## 2023-08-18 DIAGNOSIS — M545 Low back pain, unspecified: Secondary | ICD-10-CM | POA: Diagnosis not present

## 2023-08-23 DIAGNOSIS — M545 Low back pain, unspecified: Secondary | ICD-10-CM | POA: Diagnosis not present

## 2023-08-29 DIAGNOSIS — M545 Low back pain, unspecified: Secondary | ICD-10-CM | POA: Diagnosis not present

## 2023-08-31 DIAGNOSIS — M545 Low back pain, unspecified: Secondary | ICD-10-CM | POA: Diagnosis not present

## 2023-09-06 DIAGNOSIS — M545 Low back pain, unspecified: Secondary | ICD-10-CM | POA: Diagnosis not present

## 2023-09-06 DIAGNOSIS — M6281 Muscle weakness (generalized): Secondary | ICD-10-CM | POA: Diagnosis not present

## 2023-09-07 DIAGNOSIS — M6281 Muscle weakness (generalized): Secondary | ICD-10-CM | POA: Diagnosis not present

## 2023-09-07 DIAGNOSIS — M545 Low back pain, unspecified: Secondary | ICD-10-CM | POA: Diagnosis not present

## 2023-09-12 ENCOUNTER — Other Ambulatory Visit: Payer: Self-pay | Admitting: Physical Medicine and Rehabilitation

## 2023-09-12 DIAGNOSIS — M545 Low back pain, unspecified: Secondary | ICD-10-CM | POA: Diagnosis not present

## 2023-09-12 DIAGNOSIS — M6281 Muscle weakness (generalized): Secondary | ICD-10-CM | POA: Diagnosis not present

## 2023-09-13 DIAGNOSIS — M6281 Muscle weakness (generalized): Secondary | ICD-10-CM | POA: Diagnosis not present

## 2023-09-13 DIAGNOSIS — M545 Low back pain, unspecified: Secondary | ICD-10-CM | POA: Diagnosis not present

## 2023-09-14 DIAGNOSIS — M6281 Muscle weakness (generalized): Secondary | ICD-10-CM | POA: Diagnosis not present

## 2023-09-14 DIAGNOSIS — I10 Essential (primary) hypertension: Secondary | ICD-10-CM | POA: Diagnosis not present

## 2023-09-14 DIAGNOSIS — M545 Low back pain, unspecified: Secondary | ICD-10-CM | POA: Diagnosis not present

## 2023-09-14 DIAGNOSIS — G629 Polyneuropathy, unspecified: Secondary | ICD-10-CM | POA: Diagnosis not present

## 2023-09-14 DIAGNOSIS — R270 Ataxia, unspecified: Secondary | ICD-10-CM | POA: Diagnosis not present

## 2023-09-14 DIAGNOSIS — R600 Localized edema: Secondary | ICD-10-CM | POA: Diagnosis not present

## 2023-09-14 DIAGNOSIS — R200 Anesthesia of skin: Secondary | ICD-10-CM | POA: Diagnosis not present

## 2023-09-19 DIAGNOSIS — M6281 Muscle weakness (generalized): Secondary | ICD-10-CM | POA: Diagnosis not present

## 2023-09-19 DIAGNOSIS — M545 Low back pain, unspecified: Secondary | ICD-10-CM | POA: Diagnosis not present

## 2023-09-21 ENCOUNTER — Encounter: Payer: Self-pay | Admitting: Neurology

## 2023-09-21 ENCOUNTER — Other Ambulatory Visit: Payer: Self-pay | Admitting: Internal Medicine

## 2023-09-21 DIAGNOSIS — R270 Ataxia, unspecified: Secondary | ICD-10-CM

## 2023-09-22 ENCOUNTER — Ambulatory Visit
Admission: RE | Admit: 2023-09-22 | Discharge: 2023-09-22 | Disposition: A | Discharge status: Home / Self Care | Source: Ambulatory Visit | Attending: Internal Medicine | Admitting: Internal Medicine

## 2023-09-22 DIAGNOSIS — R202 Paresthesia of skin: Secondary | ICD-10-CM | POA: Diagnosis not present

## 2023-09-22 DIAGNOSIS — R270 Ataxia, unspecified: Secondary | ICD-10-CM

## 2023-09-22 MED ORDER — GADOPICLENOL 0.5 MMOL/ML IV SOLN
9.0000 mL | Freq: Once | INTRAVENOUS | Status: AC | PRN
Start: 2023-09-22 — End: 2023-09-22
  Administered 2023-09-22: 9 mL via INTRAVENOUS

## 2023-09-25 DIAGNOSIS — M545 Low back pain, unspecified: Secondary | ICD-10-CM | POA: Diagnosis not present

## 2023-09-25 DIAGNOSIS — M6281 Muscle weakness (generalized): Secondary | ICD-10-CM | POA: Diagnosis not present

## 2023-09-27 DIAGNOSIS — M6281 Muscle weakness (generalized): Secondary | ICD-10-CM | POA: Diagnosis not present

## 2023-09-27 DIAGNOSIS — M545 Low back pain, unspecified: Secondary | ICD-10-CM | POA: Diagnosis not present

## 2023-09-28 DIAGNOSIS — Z23 Encounter for immunization: Secondary | ICD-10-CM | POA: Diagnosis not present

## 2023-10-03 DIAGNOSIS — M6281 Muscle weakness (generalized): Secondary | ICD-10-CM | POA: Diagnosis not present

## 2023-10-03 DIAGNOSIS — M545 Low back pain, unspecified: Secondary | ICD-10-CM | POA: Diagnosis not present

## 2023-10-04 ENCOUNTER — Encounter (INDEPENDENT_AMBULATORY_CARE_PROVIDER_SITE_OTHER): Admitting: Ophthalmology

## 2023-10-04 DIAGNOSIS — H353221 Exudative age-related macular degeneration, left eye, with active choroidal neovascularization: Secondary | ICD-10-CM

## 2023-10-04 DIAGNOSIS — I10 Essential (primary) hypertension: Secondary | ICD-10-CM

## 2023-10-04 DIAGNOSIS — H35033 Hypertensive retinopathy, bilateral: Secondary | ICD-10-CM

## 2023-10-04 DIAGNOSIS — H43813 Vitreous degeneration, bilateral: Secondary | ICD-10-CM

## 2023-10-04 DIAGNOSIS — H353112 Nonexudative age-related macular degeneration, right eye, intermediate dry stage: Secondary | ICD-10-CM

## 2023-10-07 DIAGNOSIS — Z23 Encounter for immunization: Secondary | ICD-10-CM | POA: Diagnosis not present

## 2023-10-30 ENCOUNTER — Other Ambulatory Visit

## 2023-10-30 ENCOUNTER — Ambulatory Visit (INDEPENDENT_AMBULATORY_CARE_PROVIDER_SITE_OTHER): Admitting: Neurology

## 2023-10-30 ENCOUNTER — Encounter: Payer: Self-pay | Admitting: Neurology

## 2023-10-30 VITALS — BP 120/75 | HR 99 | Ht 68.0 in | Wt 177.0 lb

## 2023-10-30 DIAGNOSIS — R2681 Unsteadiness on feet: Secondary | ICD-10-CM | POA: Diagnosis not present

## 2023-10-30 DIAGNOSIS — R258 Other abnormal involuntary movements: Secondary | ICD-10-CM

## 2023-10-30 DIAGNOSIS — G629 Polyneuropathy, unspecified: Secondary | ICD-10-CM | POA: Diagnosis not present

## 2023-10-30 NOTE — Progress Notes (Signed)
 [facility] Clinic Note - Initial Visit   Date: 10/30/2023   Paul Herring MRN: [id number] DOB: 08/14/1940   Dear Dr. Loreli:  Thank you for your kind referral of Paul Herring for consultation of neuropathy. Although his history is well known to you, please allow us  to reiterate it for the purpose of our medical record. The patient was accompanied to the clinic by self.    Paul Herring is a 83 y.o. right-handed male with left macular degeneration, hyperlipidemia, hypertension, and alcohol  use presenting for evaluation of numbness/tingling and imbalance.   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 - NCS/EMG of the legs  - Check folate, vitamin B1, copper, SPEP with IFE  - Patient educated on daily foot inspection, fall prevention, and safety precautions around the home.  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  In June 2025, he had a fall and has been using a walker since this time.    He had tingling and numbness involving the toes and soles of the feet, which is constant.  He denies weakness. He tried gabapentin  and did not have any benefit, so switched to Cymbalta 60mg  daily, which has helped some.   He has not done PT in the past 4 weeks, since starting Cymbalta.    Nonsmoker.  He was previously drinking several drinks nightly for many years and over the past year reduced this to one alcoholic beverage nightly.  No history of diabetes or chemotherapy.    Out-side paper records, electronic medical record, and images have been reviewed where available and summarized as:  CT thoracic and lumbar spine 06/09/2023: 1. No fracture or  dislocation of the thoracic or lumbar spine. 2. Severe multilevel cervical disc degenerative disease and bridging osteophytosis throughout the thoracic and lumbar spine. Degenerative findings of the lumbar spine not significantly changed compared to MR dated 01/12/2022. Lumbar disc and neural foraminal pathology may be further evaluated by MRI if indicated by neurologically localizing signs and symptoms. 3. Coronary artery disease. 4. Cholelithiasis.  MRI lumbar spine 01/12/2022: 1. 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 2. L2-3: Mild right lateral recess and foraminal narrowing but without definite neural compression. 3. L3-4: Mild multifactorial stenosis but without definite neural compression. 4. L4-5: Moderate multifactorial stenosis that could be symptomatic. This stenosis actually appeared worse in 2015. 5. L5-S1: Mild bilateral foraminal narrowing but without definite neural compression. 6. Discogenic endplate marrow changes on the right at L1-2 with associated edema. This could relate to regional back pain.  MRI brain wwo contrast 09/22/2023: 1. No acute intracranial abnormality. 2. 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  CT head wo contrast 06/09/2023: No acute intracranial pathology.  Small-vessel white matter disease.    Past Medical History:  Diagnosis Date   Arthritis    Cataract    Chronic kidney disease    Enlarged prostate    GERD (gastroesophageal reflux disease)    Hyperlipidemia    Hypertension     Past Surgical History:  Procedure Laterality Date   ANGIOGRAM/LV (CONGENITAL)     Bladder Stent     COLONOSCOPY     POLYPECTOMY     TONSILLECTOMY       Medications:  Outpatient Encounter Medications as  of 10/30/2023  Medication Sig Note   amLODipine (NORVASC) 10 MG tablet TK 1 T PO QD 11/18/2015: Received from: External Pharmacy   Ascorbic Acid (VITAMIN C) 1000 MG tablet Take 1,000 mg by mouth  daily.    atorvastatin (LIPITOR) 20 MG tablet TK 1 T PO HS 11/18/2015: Received from: External Pharmacy   B Complex-C (SUPER B COMPLEX PO) Take by mouth.    baclofen (LIORESAL) 10 MG tablet Take 10 mg by mouth every 8 (eight) hours as needed.    betamethasone  dipropionate (DIPROLENE ) 0.05 % cream Apply topically 2 (two) times daily.    co-enzyme Q-10 30 MG capsule Take 30 mg by mouth daily.    DULoxetine (CYMBALTA) 60 MG capsule Take 60 mg by mouth daily.    finasteride (PROSCAR) 5 MG tablet TK 1 T PO D UTD 11/18/2015: Received from: External Pharmacy   gabapentin  (NEURONTIN ) 100 MG capsule TAKE 1 CAPSULE(100 MG) BY MOUTH THREE TIMES DAILY (Patient taking differently: Take 100 mg by mouth at bedtime. One capsule at bedtime)    Glucosamine-Chondroitin 500-250 MG CAPS Take 1 tablet by mouth 3 (three) times daily.    HYDROcodone -acetaminophen  (NORCO/VICODIN) 5-325 MG tablet Take 1 tablet by mouth every 6 (six) hours as needed for moderate pain (pain score 4-6).    losartan (COZAAR) 100 MG tablet TK 1 T PO D 11/18/2015: Received from: External Pharmacy   Multiple Vitamins-Minerals (OCUVITE ADULT 50+ PO) Take by mouth.    multivitamin-lutein (OCUVITE-LUTEIN) CAPS capsule Take 1 capsule by mouth daily.    Omega-3 Fatty Acids (FISH OIL) 1000 MG CAPS Take by mouth.    omeprazole (PRILOSEC) 20 MG capsule TK 1 C PO QD 11/18/2015: Received from: External Pharmacy   tamsulosin (FLOMAX) 0.4 MG CAPS capsule TK 1 C PO BID 11/18/2015: Received from: External Pharmacy   vitamin E 400 UNIT capsule Take 400 Units by mouth daily.    diazepam  (VALIUM ) 5 MG tablet Take one tablet by mouth with food one hour prior to procedure. May repeat 30 minutes prior if needed. (Patient not taking: Reported on 10/30/2023)    Facility-Administered Encounter Medications as of 10/30/2023  Medication   0.9 %  sodium chloride  infusion   dextrose  5 % solution    Allergies:  Allergies  Allergen Reactions   Pentazocine Lactate  Palpitations    Family History: Family History  Problem Relation Age of Onset   Stroke Mother    COPD Father    Anxiety disorder Father    Post-traumatic stress disorder Father    Colon cancer Neg Hx    Esophageal cancer Neg Hx    Rectal cancer Neg Hx    Stomach cancer Neg Hx     Social History: Social History   Tobacco Use   Smoking status: Former    Current packs/day: 0.00    Types: Cigarettes    Quit date: 1978    Years since quitting: 47.8   Smokeless tobacco: Never  Substance Use Topics   Alcohol  use: Not Currently    Comment: 2-3 glassses of wine    Drug use: No   Social History   Social History Narrative   Are you right handed or left handed? Right Handed   Are you currently employed ? No    What is your current occupation?   Do you live at home alone? No    Who lives with you? Wife    What type of home do you live in: 1 story or 2 story?  Two story home     Vital Signs:  BP 120/75   Pulse 99   Ht 5' 8 (1.727 m)   Wt 177 lb (80.3 kg)   SpO2 96%   BMI 26.91 kg/m    Neurological Exam: MENTAL STATUS including orientation to time, place, person, recent and remote memory, attention span and concentration, language, and fund of knowledge is normal.  Speech is not dysarthric. Slightly blunted affect.   CRANIAL NERVES: II:  No visual field defects.     III-IV-VI: Pupils equal round and reactive to light.  Normal conjugate, extra-ocular eye movements in all directions of gaze.  No nystagmus.  No ptosis.   V:  Normal facial sensation.    VII:  Normal facial symmetry and movements.   VIII:  Normal hearing and vestibular function.   IX-X:  Normal palatal movement.   XI:  Normal shoulder shrug and head rotation.   XII:  Normal tongue strength and range of motion, no deviation or fasciculation.  MOTOR:  No atrophy, fasciculations or abnormal movements.  No pronator drift. Mildly increased tone in the right arm and leg.   Upper Extremity:  Right  Left   Deltoid  5/5   5/5   Biceps  5/5   5/5   Triceps  5/5   5/5   Wrist extensors  5/5   5/5   Wrist flexors  5/5   5/5   Finger extensors  5/5   5/5   Finger flexors  5/5   5/5   Dorsal interossei  5/5   5/5   Abductor pollicis  5/5   5/5    Lower Extremity:  Right  Left  Hip flexors  5/5   5/5   Knee flexors  5/5   5/5   Knee extensors  5/5   5/5   Dorsiflexors  5/5   5/5   Plantarflexors  5/5   5/5   Toe extensors  5/5   5/5   Toe flexors  5/5   5/5    MSRs:                                           Right        Left brachioradialis 2+  2+  biceps 2+  2+  triceps 2+  2+  patellar 2+  2+  ankle jerk 0  0  Hoffman no  no  plantar response down  down   SENSORY:  Vibration is absent at the toes, diminished at the ankles, intact at the knee.  Temperature and pin prick is intact throughout.  Romberg's sign present.   COORDINATION/GAIT: Normal finger-to- nose-finger.  Intact rapid alternating movements on the right, finger and heel tapping is asymmetrically slowed on the left.  Gait is wide-based, slightly shuffling ,assisted with walker, it takes 5-6 small steps to turn.    Thank you for allowing me to participate in patient's care.  If I can answer any additional questions, I would be pleased to do so.    Sincerely,    Kinzly Pierrelouis K. Tobie, DO

## 2023-10-30 NOTE — Patient Instructions (Signed)
 Nerve testing of the legs  DAT scan  Check labs  Always use your walker  Check feet daliy  ELECTROMYOGRAM AND NERVE CONDUCTION STUDIES (EMG/NCS) INSTRUCTIONS  How to Prepare The neurologist conducting the EMG will need to know if you have certain medical conditions. Tell the neurologist and other EMG lab personnel if you: Have a pacemaker or any other electrical medical device Take blood-thinning medications Have hemophilia, a blood-clotting disorder that causes prolonged bleeding Bathing Take a shower or bath shortly before your exam in order to remove oils from your skin. Don't apply lotions or creams before the exam.  What to Expect You'll likely be asked to change into a hospital gown for the procedure and lie down on an examination table. The following explanations can help you understand what will happen during the exam.  Electrodes. The neurologist or a technician places surface electrodes at various locations on your skin depending on where you're experiencing symptoms. Or the neurologist may insert needle electrodes at different sites depending on your symptoms.  Sensations. The electrodes will at times transmit a tiny electrical current that you may feel as a twinge or spasm. The needle electrode may cause discomfort or pain that usually ends shortly after the needle is removed. If you are concerned about discomfort or pain, you may want to talk to the neurologist about taking a short break during the exam.  Instructions. During the needle EMG, the neurologist will assess whether there is any spontaneous electrical activity when the muscle is at rest - activity that isn't present in healthy muscle tissue - and the degree of activity when you slightly contract the muscle.  He or she will give you instructions on resting and contracting a muscle at appropriate times. Depending on what muscles and nerves the neurologist is examining, he or she may ask you to change positions during  the exam.  After your EMG You may experience some temporary, minor bruising where the needle electrode was inserted into your muscle. This bruising should fade within several days. If it persists, contact your primary care doctor.

## 2023-11-03 LAB — PROTEIN ELECTROPHORESIS, SERUM
Albumin ELP: 4.4 g/dL (ref 3.8–4.8)
Alpha 1: 0.3 g/dL (ref 0.2–0.3)
Alpha 2: 0.7 g/dL (ref 0.5–0.9)
Beta 2: 0.3 g/dL (ref 0.2–0.5)
Beta Globulin: 0.5 g/dL (ref 0.4–0.6)
Gamma Globulin: 0.7 g/dL — ABNORMAL LOW (ref 0.8–1.7)
Total Protein: 6.8 g/dL (ref 6.1–8.1)

## 2023-11-03 LAB — IMMUNOFIXATION ELECTROPHORESIS
IgG (Immunoglobin G), Serum: 703 mg/dL (ref 600–1540)
IgM, Serum: 125 mg/dL (ref 50–300)
Immunoglobulin A: 192 mg/dL (ref 70–320)

## 2023-11-03 LAB — VITAMIN B1: Vitamin B1 (Thiamine): 50 nmol/L — ABNORMAL HIGH (ref 8–30)

## 2023-11-03 LAB — COPPER, SERUM: Copper: 94 ug/dL (ref 70–175)

## 2023-11-03 LAB — FOLATE: Folate: >24 ng/mL

## 2023-11-06 ENCOUNTER — Ambulatory Visit: Payer: Self-pay | Admitting: Neurology

## 2023-11-06 ENCOUNTER — Encounter: Payer: Self-pay | Admitting: Radiology

## 2023-11-17 ENCOUNTER — Ambulatory Visit: Admitting: Neurology

## 2023-11-17 DIAGNOSIS — R258 Other abnormal involuntary movements: Secondary | ICD-10-CM | POA: Diagnosis not present

## 2023-11-17 DIAGNOSIS — R2681 Unsteadiness on feet: Secondary | ICD-10-CM

## 2023-11-17 NOTE — Procedures (Signed)
 [facility]  [address] [place], [address]  [place], [place] [address] Tel: [phone] Fax: [phone] Test Date:  11/17/2023  Patient: Paul Herring DOB: 04-06-1940 Physician: Tonita Blanch, DO  Sex: Male Height: 5' 8 Ref Phys: Tonita Blanch, DO  ID#: [id number]   Technician:    History: This is a 83 year old man referred for evaluation of bilateral feet paresthesias.  NCV & EMG Findings: Electrodiagnostic testing of the right lower extremity and additional studies of the left shows: Bilateral sural and superficial peroneal sensory responses are within normal limits. Bilateral peroneal and tibial motor responses are within normal limits. Bilateral tibial H reflex studies are within normal limits. There is no evidence of active or chronic motor axonal changes affecting any of the tested muscles.  Motor unit configuration and recruitment pattern is within normal limits.  Impression: This is a normal study of the lower extremities.  In particular, there is no evidence of a large fiber sensorimotor polyneuropathy or lumbosacral radiculopathy.    ___________________________ Tonita Blanch, DO    Nerve Conduction Studies   Stim Site NR Peak (ms) Norm Peak (ms) O-P Amp (V) Norm O-P Amp  Left Sup Peroneal Anti Sensory (Ant Lat Mall)  32 C  12 cm    3.0 <4.6 4.6 >3  Right Sup Peroneal Anti Sensory (Ant Lat Mall)  32 C  12 cm    2.4 <4.6 5.9 >3  Left Sural Anti Sensory (Lat Mall)  32 C  Calf    3.4 <4.6 5.1 >3  Right Sural Anti Sensory (Lat Mall)  32 C  Calf    3.8 <4.6 6.3 >3     Stim Site NR Onset (ms) Norm Onset (ms) O-P Amp (mV) Norm O-P Amp Site1 Site2 Delta-0 (ms) Dist (cm) Vel (m/s) Norm Vel (m/s)  Left Peroneal Motor (Ext Dig Brev)  32 C  Ankle    5.2 <6.0 3.0 >2.5 B Fib Ankle 7.3 34.0 47 >40  B Fib    12.5  2.4  Poplt B Fib 1.8 8.0 44 >40  Poplt    14.3  2.2         Right Peroneal Motor (Ext Dig Brev)  32 C  Ankle    3.8 <6.0 3.7 >2.5 B Fib Ankle  7.4 36.0 49 >40  B Fib    11.2  3.4  Poplt B Fib 1.9 8.0 42 >40  Poplt    13.1  3.2         Left Tibial Motor (Abd Hall Brev)  32 C  Ankle    4.6 <6.0 6.7 >4 Knee Ankle 9.6 40.0 42 >40  Knee    14.2  4.4         Right Tibial Motor (Abd Hall Brev)  32 C  Ankle    3.9 <6.0 7.0 >4 Knee Ankle 9.1 40.0 44 >40  Knee    13.0  5.2          Electromyography   Side Muscle Ins.Act Fibs Fasc Recrt Amp Dur Poly Activation Comment  Right AntTibialis Nml Nml Nml Nml Nml Nml Nml Nml N/A  Right Gastroc Nml Nml Nml Nml Nml Nml Nml Nml N/A  Right Flex Dig Long Nml Nml Nml Nml Nml Nml Nml Nml N/A  Right RectFemoris Nml Nml Nml Nml Nml Nml Nml Nml N/A  Right BicepsFemS Nml Nml Nml Nml Nml Nml Nml Nml N/A  Right GluteusMed Nml Nml Nml Nml Nml Nml Nml Nml N/A  Left AntTibialis  Nml Nml Nml Nml Nml Nml Nml Nml N/A  Left Gastroc Nml Nml Nml Nml Nml Nml Nml Nml N/A  Left Flex Dig Long Nml Nml Nml Nml Nml Nml Nml Nml N/A  Left RectFemoris Nml Nml Nml Nml Nml Nml Nml Nml N/A  Left GluteusMed Nml Nml Nml Nml Nml Nml Nml Nml N/A      Waveforms:

## 2023-11-21 ENCOUNTER — Encounter (INDEPENDENT_AMBULATORY_CARE_PROVIDER_SITE_OTHER): Admitting: Ophthalmology

## 2023-11-21 DIAGNOSIS — I10 Essential (primary) hypertension: Secondary | ICD-10-CM | POA: Diagnosis not present

## 2023-11-21 DIAGNOSIS — H353221 Exudative age-related macular degeneration, left eye, with active choroidal neovascularization: Secondary | ICD-10-CM | POA: Diagnosis not present

## 2023-11-21 DIAGNOSIS — H353112 Nonexudative age-related macular degeneration, right eye, intermediate dry stage: Secondary | ICD-10-CM | POA: Diagnosis not present

## 2023-11-21 DIAGNOSIS — H35033 Hypertensive retinopathy, bilateral: Secondary | ICD-10-CM

## 2023-11-21 DIAGNOSIS — H43813 Vitreous degeneration, bilateral: Secondary | ICD-10-CM | POA: Diagnosis not present

## 2023-11-21 NOTE — Telephone Encounter (Signed)
 Left a message with the after hour service on 11-20-23 at4:43 pm   Caller is returning a call to the office

## 2023-11-28 ENCOUNTER — Encounter (HOSPITAL_COMMUNITY)

## 2023-12-05 ENCOUNTER — Ambulatory Visit: Admitting: Neurology

## 2024-01-10 ENCOUNTER — Ambulatory Visit (HOSPITAL_COMMUNITY)
Admission: RE | Admit: 2024-01-10 | Discharge: 2024-01-10 | Disposition: A | Discharge status: Home / Self Care | Source: Ambulatory Visit | Attending: Neurology | Admitting: Neurology

## 2024-01-10 DIAGNOSIS — R2681 Unsteadiness on feet: Secondary | ICD-10-CM | POA: Insufficient documentation

## 2024-01-10 DIAGNOSIS — G629 Polyneuropathy, unspecified: Secondary | ICD-10-CM | POA: Insufficient documentation

## 2024-01-10 DIAGNOSIS — R258 Other abnormal involuntary movements: Secondary | ICD-10-CM | POA: Diagnosis present

## 2024-01-10 MED ORDER — IOFLUPANE I 123 185 MBQ/2.5ML IV SOLN
5.0000 | Freq: Once | INTRAVENOUS | Status: AC
  Administered 2024-01-10: 4.3 via INTRAVENOUS
  Filled 2024-01-10: qty 5

## 2024-01-10 MED ORDER — POTASSIUM IODIDE (ANTIDOTE) 130 MG PO TABS
ORAL_TABLET | ORAL | Status: AC
  Filled 2024-01-10: qty 1

## 2024-01-17 ENCOUNTER — Encounter (INDEPENDENT_AMBULATORY_CARE_PROVIDER_SITE_OTHER): Admitting: Ophthalmology

## 2024-01-17 DIAGNOSIS — I10 Essential (primary) hypertension: Secondary | ICD-10-CM | POA: Diagnosis not present

## 2024-01-17 DIAGNOSIS — H353112 Nonexudative age-related macular degeneration, right eye, intermediate dry stage: Secondary | ICD-10-CM

## 2024-01-17 DIAGNOSIS — H43813 Vitreous degeneration, bilateral: Secondary | ICD-10-CM

## 2024-01-17 DIAGNOSIS — H353221 Exudative age-related macular degeneration, left eye, with active choroidal neovascularization: Secondary | ICD-10-CM

## 2024-01-17 DIAGNOSIS — H35033 Hypertensive retinopathy, bilateral: Secondary | ICD-10-CM | POA: Diagnosis not present

## 2024-02-14 ENCOUNTER — Ambulatory Visit: Admitting: Neurology

## 2024-02-21 ENCOUNTER — Ambulatory Visit: Admitting: Neurology

## 2024-03-13 ENCOUNTER — Encounter (INDEPENDENT_AMBULATORY_CARE_PROVIDER_SITE_OTHER): Admitting: Ophthalmology
